# Patient Record
Sex: Male | Born: 1955
Health system: Southern US, Community
[De-identification: ages and names within clinical notes are randomized; demographics above are authoritative.]

## PROBLEM LIST (undated history)

## (undated) DIAGNOSIS — I10 Essential (primary) hypertension: Secondary | ICD-10-CM

## (undated) DIAGNOSIS — E785 Hyperlipidemia, unspecified: Secondary | ICD-10-CM

## (undated) HISTORY — PX: NOSE SURGERY: SHX723

## (undated) HISTORY — DX: Hyperlipidemia, unspecified: E78.5

## (undated) HISTORY — DX: Essential (primary) hypertension: I10

---

## 2012-10-03 ENCOUNTER — Other Ambulatory Visit: Payer: Self-pay | Admitting: *Deleted

## 2012-10-03 MED ORDER — AMLODIPINE BESYLATE 5 MG PO TABS: 5.0000 mg | ORAL_TABLET | Freq: Every day | ORAL | Status: DC

## 2012-10-03 NOTE — Telephone Encounter (Signed)
LAST OV 12/13. 

## 2012-11-06 ENCOUNTER — Other Ambulatory Visit: Payer: Self-pay | Admitting: *Deleted

## 2012-11-06 NOTE — Telephone Encounter (Signed)
Patient last seen on 03-29-12. Please advise

## 2012-11-07 ENCOUNTER — Other Ambulatory Visit: Payer: Self-pay | Admitting: Family Medicine

## 2012-11-07 DIAGNOSIS — I10 Essential (primary) hypertension: Secondary | ICD-10-CM

## 2012-11-07 MED ORDER — AMLODIPINE BESYLATE 5 MG PO TABS: 5.0000 mg | ORAL_TABLET | Freq: Every day | ORAL | Status: DC

## 2012-11-07 NOTE — Telephone Encounter (Signed)
Amlodipine sent to pharmacy. 

## 2012-11-20 ENCOUNTER — Ambulatory Visit (INDEPENDENT_AMBULATORY_CARE_PROVIDER_SITE_OTHER): Payer: BC Managed Care – PPO | Admitting: Family Medicine

## 2012-11-20 ENCOUNTER — Encounter: Payer: Self-pay | Admitting: Family Medicine

## 2012-11-20 VITALS — BP 139/84 | HR 77 | Temp 97.3°F | Ht 70.0 in | Wt 193.0 lb

## 2012-11-20 DIAGNOSIS — E785 Hyperlipidemia, unspecified: Secondary | ICD-10-CM

## 2012-11-20 DIAGNOSIS — I10 Essential (primary) hypertension: Secondary | ICD-10-CM

## 2012-11-20 DIAGNOSIS — Z0000 Encounter for general adult medical examination without abnormal findings: Secondary | ICD-10-CM

## 2012-11-20 LAB — POCT CBC
Granulocyte percent: 74.4 %G (ref 37–80)
HCT, POC: 43.4 % — AB (ref 43.5–53.7)
Hemoglobin: 14.9 g/dL (ref 14.1–18.1)
Lymph, poc: 1.4 (ref 0.6–3.4)
MCH, POC: 27.7 pg (ref 27–31.2)
MCHC: 34.4 g/dL (ref 31.8–35.4)
MCV: 80.5 fL (ref 80–97)
MPV: 7.9 fL (ref 0–99.8)
POC Granulocyte: 4.8 (ref 2–6.9)
POC LYMPH PERCENT: 22.2 %L (ref 10–50)
Platelet Count, POC: 172 10*3/uL (ref 142–424)
RBC: 5.4 M/uL (ref 4.69–6.13)
RDW, POC: 12.6 %
WBC: 6.5 10*3/uL (ref 4.6–10.2)

## 2012-11-20 MED ORDER — AMLODIPINE BESYLATE 5 MG PO TABS: 5.0000 mg | ORAL_TABLET | Freq: Every day | ORAL | Status: DC

## 2012-11-20 MED ORDER — ATORVASTATIN CALCIUM 40 MG PO TABS: 40.0000 mg | ORAL_TABLET | Freq: Every day | ORAL | Status: DC

## 2012-11-20 NOTE — Patient Instructions (Signed)

## 2012-11-20 NOTE — Progress Notes (Signed)
  Subjective:    Patient ID: Fernando Cooper, male    DOB: 1955-11-28, 56 y.o.   MRN: [id number]  HPI  This 57 y.o. male presents for evaluation of hypertension and hyperlipidemia.  He Has not been seen since 12/14.  He has not had a PSA.  He declines colonoscopy. He denies any other medical problems.  Review of Systems No chest pain, SOB, HA, dizziness, vision change, N/V, diarrhea, constipation, dysuria, urinary urgency or frequency, myalgias, arthralgias or rash.     Objective:   Physical Exam  Vital signs noted  Well developed well nourished male.  HEENT - Head atraumatic Normocephalic                Eyes - PERRLA, Conjuctiva - clear Sclera- Clear EOMI                Ears - EAC's Wnl TM's Wnl Gross Hearing WNL                Nose - Nares patent                 Throat - oropharanx wnl Respiratory - Lungs CTA bilateral Cardiac - RRR S1 and S2 without murmur GI - Abdomen soft Nontender and bowel sounds active x 4 Extremities - No edema. Neuro - Grossly intact.      Assessment & Plan:  Essential hypertension, benign - Plan: amLODipine (NORVASC) 5 MG tablet, controlled.  Other and unspecified hyperlipidemia - Plan: atorvastatin (LIPITOR) 40 MG tablet, check lipid panel today.  Routine general medical examination at a health care facility - Plan: POCT CBC, CMP14+EGFR, Lipid panel, PSA, total and free, TSH Declines colonoscopy and instructed patient to follow up in 6 months.

## 2012-11-21 LAB — PSA, TOTAL AND FREE
PSA, Free Pct: 35 %
PSA, Free: 0.35 ng/mL
PSA: 1 ng/mL (ref 0.0–4.0)

## 2012-11-21 LAB — CMP14+EGFR
ALT: 15 IU/L (ref 0–44)
AST: 19 IU/L (ref 0–40)
Albumin/Globulin Ratio: 2.6 — ABNORMAL HIGH (ref 1.1–2.5)
Albumin: 4.5 g/dL (ref 3.5–5.5)
Alkaline Phosphatase: 128 IU/L — ABNORMAL HIGH (ref 39–117)
BUN/Creatinine Ratio: 7 — ABNORMAL LOW (ref 9–20)
BUN: 7 mg/dL (ref 6–24)
CO2: 23 mmol/L (ref 18–29)
Calcium: 9.3 mg/dL (ref 8.7–10.2)
Chloride: 103 mmol/L (ref 97–108)
Creatinine, Ser: 1 mg/dL (ref 0.76–1.27)
GFR calc Af Amer: 95 mL/min/{1.73_m2} (ref >59)
GFR calc non Af Amer: 83 mL/min/{1.73_m2} (ref >59)
Globulin, Total: 1.7 g/dL (ref 1.5–4.5)
Glucose: 88 mg/dL (ref 65–99)
Potassium: 4 mmol/L (ref 3.5–5.2)
Sodium: 141 mmol/L (ref 134–144)
Total Bilirubin: 1.2 mg/dL (ref 0.0–1.2)
Total Protein: 6.2 g/dL (ref 6.0–8.5)

## 2012-11-21 LAB — LIPID PANEL
Chol/HDL Ratio: 3.9 ratio units (ref 0.0–5.0)
Cholesterol, Total: 138 mg/dL (ref 100–199)
HDL: 35 mg/dL — ABNORMAL LOW (ref >39)
LDL Calculated: 78 mg/dL (ref 0–99)
Triglycerides: 126 mg/dL (ref 0–149)
VLDL Cholesterol Cal: 25 mg/dL (ref 5–40)

## 2012-11-21 LAB — TSH: TSH: 1.48 u[IU]/mL (ref 0.450–4.500)

## 2012-11-22 ENCOUNTER — Other Ambulatory Visit: Payer: Self-pay | Admitting: Family Medicine

## 2013-11-13 ENCOUNTER — Other Ambulatory Visit: Payer: Self-pay | Admitting: *Deleted

## 2013-11-13 ENCOUNTER — Telehealth: Payer: Self-pay | Admitting: Family Medicine

## 2013-11-13 DIAGNOSIS — I10 Essential (primary) hypertension: Secondary | ICD-10-CM

## 2013-11-13 MED ORDER — AMLODIPINE BESYLATE 5 MG PO TABS: 5.0000 mg | ORAL_TABLET | Freq: Every day | ORAL | Status: DC

## 2013-11-13 NOTE — Telephone Encounter (Signed)
Last ov 8/14. ntbs 

## 2013-11-13 NOTE — Telephone Encounter (Signed)
Sent to [facility] for review. 

## 2014-02-12 ENCOUNTER — Other Ambulatory Visit: Payer: Self-pay | Admitting: Family Medicine

## 2014-02-13 ENCOUNTER — Encounter: Payer: Self-pay | Admitting: Family Medicine

## 2014-02-13 ENCOUNTER — Ambulatory Visit (INDEPENDENT_AMBULATORY_CARE_PROVIDER_SITE_OTHER): Payer: BC Managed Care – PPO | Admitting: Family Medicine

## 2014-02-13 VITALS — BP 141/84 | HR 73 | Temp 97.9°F | Ht 70.0 in | Wt 190.2 lb

## 2014-02-13 DIAGNOSIS — E785 Hyperlipidemia, unspecified: Secondary | ICD-10-CM

## 2014-02-13 DIAGNOSIS — I10 Essential (primary) hypertension: Secondary | ICD-10-CM

## 2014-02-13 DIAGNOSIS — Z139 Encounter for screening, unspecified: Secondary | ICD-10-CM

## 2014-02-13 LAB — POCT CBC
Granulocyte percent: 23.6 %G — AB (ref 37–80)
HCT, POC: 43.6 % (ref 43.5–53.7)
Hemoglobin: 14.6 g/dL (ref 14.1–18.1)
Lymph, poc: 1.5 (ref 0.6–3.4)
MCH, POC: 27.8 pg (ref 27–31.2)
MCHC: 33.5 g/dL (ref 31.8–35.4)
MCV: 82.9 fL (ref 80–97)
MPV: 7.7 fL (ref 0–99.8)
POC Granulocyte: 4.7 (ref 2–6.9)
POC LYMPH PERCENT: 23.6 %L (ref 10–50)
Platelet Count, POC: 185 10*3/uL (ref 142–424)
RBC: 5.3 M/uL (ref 4.69–6.13)
RDW, POC: 13.2 %
WBC: 6.4 10*3/uL (ref 4.6–10.2)

## 2014-02-13 MED ORDER — ATORVASTATIN CALCIUM 40 MG PO TABS: 40.0000 mg | ORAL_TABLET | Freq: Every day | ORAL | Status: DC

## 2014-02-13 MED ORDER — AMLODIPINE BESYLATE 5 MG PO TABS: 5.0000 mg | ORAL_TABLET | Freq: Every day | ORAL | Status: DC

## 2014-02-13 NOTE — Progress Notes (Signed)
   Subjective:    Patient ID: NASZIR COTT, male    DOB: 27-Dec-1955, 58 y.o.   MRN: [id number]  HPI Patient is here for follow up. He has hx of hypertension and hyperlipidemia.  He is needing med refills.  He is due for labs.  He denies any acute medical complaints.  Review of Systems  Constitutional: Negative for fever.  HENT: Negative for ear pain.   Eyes: Negative for discharge.  Respiratory: Negative for cough.   Cardiovascular: Negative for chest pain.  Gastrointestinal: Negative for abdominal distention.  Endocrine: Negative for polyuria.  Genitourinary: Negative for difficulty urinating.  Musculoskeletal: Negative for gait problem and neck pain.  Skin: Negative for color change and rash.  Neurological: Negative for speech difficulty and headaches.  Psychiatric/Behavioral: Negative for agitation.       Objective:    BP 141/84 mmHg  Pulse 73  Temp(Src) 97.9 F (36.6 C) (Oral)  Ht _0  (1.778 m)  Wt 190 lb 3.2 oz (86.274 kg)  BMI 27.29 kg/m2   Physical Exam  Constitutional: He is oriented to person, place, and time. He appears well-developed and well-nourished.  HENT:  Head: Normocephalic and atraumatic.  Mouth/Throat: Oropharynx is clear and moist.  Eyes: Pupils are equal, round, and reactive to light.  Neck: Normal range of motion. Neck supple.  Cardiovascular: Normal rate and regular rhythm.   No murmur heard. Pulmonary/Chest: Effort normal and breath sounds normal.  Abdominal: Soft. Bowel sounds are normal. There is no tenderness.  Neurological: He is alert and oriented to person, place, and time.  Skin: Skin is warm and dry.  Psychiatric: He has a normal mood and affect.          Assessment & Plan:     ICD-9-CM ICD-10-CM   1. Hyperlipemia 272.4 E78.5 CMP14+EGFR     Lipid panel     atorvastatin (LIPITOR) 40 MG tablet  2. Essential hypertension, benign 401.1 I10 POCT CBC     CMP14+EGFR     amLODipine (NORVASC) 5 MG tablet  3. Screening V82.9  Z13.9 POCT CBC     PSA, total and free     Thyroid Panel With TSH  4. Essential hypertension 401.9 I10 atorvastatin (LIPITOR) 40 MG tablet     No Follow-up on file.  Lysbeth Penner FNP

## 2014-02-14 LAB — CMP14+EGFR
ALT: 15 IU/L (ref 0–44)
AST: 18 IU/L (ref 0–40)
Albumin/Globulin Ratio: 2.2 (ref 1.1–2.5)
Albumin: 4.3 g/dL (ref 3.5–5.5)
Alkaline Phosphatase: 120 IU/L — ABNORMAL HIGH (ref 39–117)
BUN/Creatinine Ratio: 6 — ABNORMAL LOW (ref 9–20)
BUN: 7 mg/dL (ref 6–24)
CO2: 22 mmol/L (ref 18–29)
Calcium: 9.3 mg/dL (ref 8.7–10.2)
Chloride: 102 mmol/L (ref 97–108)
Creatinine, Ser: 1.1 mg/dL (ref 0.76–1.27)
GFR calc Af Amer: 84 mL/min/{1.73_m2} (ref >59)
GFR calc non Af Amer: 73 mL/min/{1.73_m2} (ref >59)
Globulin, Total: 2 g/dL (ref 1.5–4.5)
Glucose: 89 mg/dL (ref 65–99)
Potassium: 4 mmol/L (ref 3.5–5.2)
Sodium: 141 mmol/L (ref 134–144)
Total Bilirubin: 1.2 mg/dL (ref 0.0–1.2)
Total Protein: 6.3 g/dL (ref 6.0–8.5)

## 2014-02-14 LAB — LIPID PANEL
Chol/HDL Ratio: 4.8 ratio units (ref 0.0–5.0)
Cholesterol, Total: 154 mg/dL (ref 100–199)
HDL: 32 mg/dL — ABNORMAL LOW (ref >39)
LDL Calculated: 68 mg/dL (ref 0–99)
Triglycerides: 271 mg/dL — ABNORMAL HIGH (ref 0–149)
VLDL Cholesterol Cal: 54 mg/dL — ABNORMAL HIGH (ref 5–40)

## 2014-02-14 LAB — THYROID PANEL WITH TSH
Free Thyroxine Index: 2.6 (ref 1.2–4.9)
T3 Uptake Ratio: 27 % (ref 24–39)
T4, Total: 9.7 ug/dL (ref 4.5–12.0)
TSH: 1.11 u[IU]/mL (ref 0.450–4.500)

## 2014-02-14 LAB — PSA, TOTAL AND FREE
PSA, Free Pct: 30 %
PSA, Free: 0.36 ng/mL
PSA: 1.2 ng/mL (ref 0.0–4.0)

## 2015-03-13 ENCOUNTER — Other Ambulatory Visit: Payer: Self-pay | Admitting: Family Medicine

## 2015-03-16 ENCOUNTER — Other Ambulatory Visit: Payer: Self-pay | Admitting: Family Medicine

## 2015-03-16 NOTE — Telephone Encounter (Signed)
Called and informed patient that he will need to be seen for refills

## 2015-03-17 ENCOUNTER — Ambulatory Visit (INDEPENDENT_AMBULATORY_CARE_PROVIDER_SITE_OTHER): Payer: BLUE CROSS/BLUE SHIELD | Admitting: Family Medicine

## 2015-03-17 ENCOUNTER — Encounter: Payer: Self-pay | Admitting: Family Medicine

## 2015-03-17 VITALS — BP 130/88 | HR 73 | Temp 97.3°F | Ht 70.0 in | Wt 188.6 lb

## 2015-03-17 DIAGNOSIS — Z1211 Encounter for screening for malignant neoplasm of colon: Secondary | ICD-10-CM

## 2015-03-17 DIAGNOSIS — I10 Essential (primary) hypertension: Secondary | ICD-10-CM | POA: Diagnosis not present

## 2015-03-17 DIAGNOSIS — N400 Enlarged prostate without lower urinary tract symptoms: Secondary | ICD-10-CM | POA: Diagnosis not present

## 2015-03-17 DIAGNOSIS — E785 Hyperlipidemia, unspecified: Secondary | ICD-10-CM

## 2015-03-17 MED ORDER — AMLODIPINE BESYLATE 5 MG PO TABS
5.0000 mg | ORAL_TABLET | Freq: Every day | ORAL | Status: DC
Start: 2015-03-17 — End: 2016-03-08

## 2015-03-17 MED ORDER — ATORVASTATIN CALCIUM 40 MG PO TABS: 40.0000 mg | ORAL_TABLET | Freq: Every day | ORAL | Status: DC

## 2015-03-17 NOTE — Assessment & Plan Note (Signed)
Continue cholesterol and recheck levels.

## 2015-03-17 NOTE — Assessment & Plan Note (Signed)
Continue medication for blood pressure, controlled, will monitor home measurements over the next year.

## 2015-03-17 NOTE — Progress Notes (Signed)
BP 130/88 mmHg  Pulse 73  Temp(Src) 97.3 F (36.3 C) (Oral)  Ht '5\' 10"'  (1.778 m)  Wt 188 lb 9.6 oz (85.548 kg)  BMI 27.06 kg/m2   Subjective:    Patient ID: Fernando Cooper, male    DOB: 1955-12-01, 59 y.o.   MRN: [id number]  HPI: Fernando Cooper is a 59 y.o. male presenting on 03/17/2015 for Medication Refill   HPI Hypertension Patient presents today for recheck on his high blood pressure. He is currently taking Norvasc 5 mg. His blood pressure today is 130/88. He says sometimes at home she gets readings that are over 140 but most the time they're in the 130s. Patient denies headaches, blurred vision, chest pains, shortness of breath, or weakness. Denies any side effects from medication and is content with current medication.  Hyperlipidemia Patient presents today for a recheck on his cholesterol as well. We will put in labs so that he can do them fasting. He will come back for that. He denies any myalgias or issues with his medications.  Relevant past medical, surgical, family and social history reviewed and updated as indicated. Interim medical history since our last visit reviewed. Allergies and medications reviewed and updated.  Review of Systems  Constitutional: Negative for fever and chills.  HENT: Negative for congestion, ear discharge and ear pain.   Eyes: Negative for discharge and visual disturbance.  Respiratory: Negative for chest tightness, shortness of breath and wheezing.   Cardiovascular: Negative for chest pain and leg swelling.  Gastrointestinal: Negative for abdominal pain, diarrhea and constipation.  Genitourinary: Negative for difficulty urinating.  Musculoskeletal: Negative for back pain, arthralgias and gait problem.  Skin: Negative for rash.  Neurological: Negative for dizziness, syncope, light-headedness and headaches.  All other systems reviewed and are negative.   Per HPI unless specifically indicated above     Medication List       This list is  accurate as of: 03/17/15 10:16 AM.  Always use your most recent med list.               amLODipine 5 MG tablet  Commonly known as:  NORVASC  Take 1 tablet (5 mg total) by mouth daily.     atorvastatin 40 MG tablet  Commonly known as:  LIPITOR  Take 1 tablet (40 mg total) by mouth daily.           Objective:    BP 130/88 mmHg  Pulse 73  Temp(Src) 97.3 F (36.3 C) (Oral)  Ht '5\' 10"'  (1.778 m)  Wt 188 lb 9.6 oz (85.548 kg)  BMI 27.06 kg/m2  Wt Readings from Last 3 Encounters:  03/17/15 188 lb 9.6 oz (85.548 kg)  02/13/14 190 lb 3.2 oz (86.274 kg)  11/20/12 193 lb (87.544 kg)    Physical Exam  Constitutional: He is oriented to person, place, and time. He appears well-developed and well-nourished. No distress.  Eyes: Conjunctivae and EOM are normal. Pupils are equal, round, and reactive to light. Right eye exhibits no discharge. No scleral icterus.  Neck: Neck supple. No thyromegaly present.  Cardiovascular: Normal rate, regular rhythm, normal heart sounds and intact distal pulses.   No murmur heard. Pulmonary/Chest: Effort normal and breath sounds normal. No respiratory distress. He has no wheezes.  Abdominal: He exhibits no distension.  Musculoskeletal: Normal range of motion. He exhibits no edema or tenderness.  Lymphadenopathy:    He has no cervical adenopathy.  Neurological: He is alert and oriented to person, place,  and time. Coordination normal.  Skin: Skin is warm and dry. No rash noted. He is not diaphoretic.  Psychiatric: He has a normal mood and affect. His behavior is normal.  Nursing note and vitals reviewed.   Results for orders placed or performed in visit on 02/13/14  CMP14+EGFR  Result Value Ref Range   Glucose 89 65 - 99 mg/dL   BUN 7 6 - 24 mg/dL   Creatinine, Ser 1.10 0.76 - 1.27 mg/dL   GFR calc non Af Amer 73 >59 mL/min/1.73   GFR calc Af Amer 84 >59 mL/min/1.73   BUN/Creatinine Ratio 6 (L) 9 - 20   Sodium 141 134 - 144 mmol/L   Potassium 4.0  3.5 - 5.2 mmol/L   Chloride 102 97 - 108 mmol/L   CO2 22 18 - 29 mmol/L   Calcium 9.3 8.7 - 10.2 mg/dL   Total Protein 6.3 6.0 - 8.5 g/dL   Albumin 4.3 3.5 - 5.5 g/dL   Globulin, Total 2.0 1.5 - 4.5 g/dL   Albumin/Globulin Ratio 2.2 1.1 - 2.5   Total Bilirubin 1.2 0.0 - 1.2 mg/dL   Alkaline Phosphatase 120 (H) 39 - 117 IU/L   AST 18 0 - 40 IU/L   ALT 15 0 - 44 IU/L  Lipid panel  Result Value Ref Range   Cholesterol, Total 154 100 - 199 mg/dL   Triglycerides 271 (H) 0 - 149 mg/dL   HDL 32 (L) >39 mg/dL   VLDL Cholesterol Cal 54 (H) 5 - 40 mg/dL   LDL Calculated 68 0 - 99 mg/dL   Chol/HDL Ratio 4.8 0.0 - 5.0 ratio units  PSA, total and free  Result Value Ref Range   PSA 1.2 0.0 - 4.0 ng/mL   PSA, Free 0.36 N/A ng/mL   PSA, Free Pct 30.0 %  Thyroid Panel With TSH  Result Value Ref Range   TSH 1.110 0.450 - 4.500 uIU/mL   T4, Total 9.7 4.5 - 12.0 ug/dL   T3 Uptake Ratio 27 24 - 39 %   Free Thyroxine Index 2.6 1.2 - 4.9  POCT CBC  Result Value Ref Range   WBC 6.4 4.6 - 10.2 K/uL   Lymph, poc 1.5 0.6 - 3.4   POC LYMPH PERCENT 23.6 10 - 50 %L   POC Granulocyte 4.7 2 - 6.9   Granulocyte percent 23.6 (A) 37 - 80 %G   RBC 5.3 4.69 - 6.13 M/uL   Hemoglobin 14.6 14.1 - 18.1 g/dL   HCT, POC 43.6 43.5 - 53.7 %   MCV 82.9 80 - 97 fL   MCH, POC 27.8 27 - 31.2 pg   MCHC 33.5 31.8 - 35.4 g/dL   RDW, POC 13.2 %   Platelet Count, POC 185.0 142 - 424 K/uL   MPV 7.7 0 - 99.8 fL      Assessment & Plan:   Problem List Items Addressed This Visit      Cardiovascular and Mediastinum   Essential hypertension, benign    Continue medication for blood pressure, controlled, will monitor home measurements over the next year.      Relevant Medications   amLODipine (NORVASC) 5 MG tablet   atorvastatin (LIPITOR) 40 MG tablet   Other Relevant Orders   CMP14+EGFR     Other   Hyperlipemia    Continue cholesterol and recheck levels.      Relevant Medications   amLODipine (NORVASC) 5 MG  tablet   atorvastatin (LIPITOR) 40 MG tablet  Other Relevant Orders   Lipid panel    Other Visit Diagnoses    Special screening for malignant neoplasms, colon    -  Primary    Relevant Orders    Fecal occult blood, imunochemical    Enlarged prostate        Relevant Orders    PSA, total and free        Follow up plan: Return in about 1 year (around 03/16/2016), or if symptoms worsen or fail to improve.  Counseling provided for all of the vaccine components Orders Placed This Encounter  Procedures  . Fecal occult blood, imunochemical  . CMP14+EGFR  . Lipid panel  . PSA, total and free    Caryl Pina, MD [facility] 03/17/2015, 10:16 AM

## 2015-10-07 DIAGNOSIS — H20021 Recurrent acute iridocyclitis, right eye: Secondary | ICD-10-CM | POA: Diagnosis not present

## 2015-10-09 DIAGNOSIS — H20021 Recurrent acute iridocyclitis, right eye: Secondary | ICD-10-CM | POA: Diagnosis not present

## 2015-10-09 DIAGNOSIS — H40051 Ocular hypertension, right eye: Secondary | ICD-10-CM | POA: Diagnosis not present

## 2015-10-27 DIAGNOSIS — H15101 Unspecified episcleritis, right eye: Secondary | ICD-10-CM | POA: Diagnosis not present

## 2015-10-27 DIAGNOSIS — H40051 Ocular hypertension, right eye: Secondary | ICD-10-CM | POA: Diagnosis not present

## 2015-11-10 DIAGNOSIS — H20021 Recurrent acute iridocyclitis, right eye: Secondary | ICD-10-CM | POA: Diagnosis not present

## 2016-03-08 ENCOUNTER — Other Ambulatory Visit: Payer: Self-pay | Admitting: Family Medicine

## 2016-03-08 DIAGNOSIS — I10 Essential (primary) hypertension: Secondary | ICD-10-CM

## 2016-03-08 NOTE — Telephone Encounter (Signed)
Last seen 03/17/2015, please advise and route to pools

## 2016-03-11 ENCOUNTER — Ambulatory Visit: Payer: BLUE CROSS/BLUE SHIELD | Admitting: Family Medicine

## 2016-03-11 ENCOUNTER — Ambulatory Visit (INDEPENDENT_AMBULATORY_CARE_PROVIDER_SITE_OTHER): Payer: BLUE CROSS/BLUE SHIELD | Admitting: Family Medicine

## 2016-03-11 ENCOUNTER — Encounter: Payer: Self-pay | Admitting: Family Medicine

## 2016-03-11 VITALS — BP 163/93 | HR 61 | Temp 97.3°F | Ht 70.0 in | Wt 190.0 lb

## 2016-03-11 DIAGNOSIS — Z125 Encounter for screening for malignant neoplasm of prostate: Secondary | ICD-10-CM

## 2016-03-11 DIAGNOSIS — E7800 Pure hypercholesterolemia, unspecified: Secondary | ICD-10-CM | POA: Diagnosis not present

## 2016-03-11 DIAGNOSIS — I10 Essential (primary) hypertension: Secondary | ICD-10-CM

## 2016-03-11 MED ORDER — AMLODIPINE BESYLATE 5 MG PO TABS: 5.0000 mg | ORAL_TABLET | Freq: Every day | ORAL | 3 refills | Status: DC

## 2016-03-11 MED ORDER — ATORVASTATIN CALCIUM 40 MG PO TABS: 40.0000 mg | ORAL_TABLET | Freq: Every day | ORAL | 3 refills | Status: DC

## 2016-03-11 NOTE — Progress Notes (Signed)
BP (!) 159/91 (BP Location: Left Arm)   Pulse 84   Temp 97.3 F (36.3 C) (Oral)   Ht '5\' 10"'  (1.778 m)   Wt 190 lb (86.2 kg)   BMI 27.26 kg/m    Subjective:    Patient ID: Fernando Cooper, male    DOB: Dec 02, 1955, 60 y.o.   MRN: [id number]  HPI: Fernando Cooper is a 60 y.o. male presenting on 03/11/2016 for Hyperlipidemia (yearly) and Hypertension   HPI Hypertension Patient is coming in for a blood pressure recheck today. His blood pressure is elevated at 159/91. He is currently on amlodipine 5 mg. On his last visit 1 year ago his blood pressure was controlled. He says he ate some very salty stuff over the past few days that may have affected his blood pressure and he will change his diet and wants to try that before changing any medication. Patient denies headaches, blurred vision, chest pains, shortness of breath, or weakness. Denies any side effects from medication and is content with current medication.   Hyperlipidemia Patient is coming in for cholesterol check and fasting labs today. He is currently on Lipitor 40 mg. He denies any issues with myalgias or liver issues and knows of. He denies any focal numbness or weakness or chest pain or palpitations.  Relevant past medical, surgical, family and social history reviewed and updated as indicated. Interim medical history since our last visit reviewed. Allergies and medications reviewed and updated.  Review of Systems  Constitutional: Negative for chills and fever.  Respiratory: Negative for shortness of breath and wheezing.   Cardiovascular: Negative for chest pain and leg swelling.  Musculoskeletal: Negative for back pain and gait problem.  Skin: Negative for rash.  Neurological: Negative for dizziness, weakness, light-headedness, numbness and headaches.  All other systems reviewed and are negative.   Per HPI unless specifically indicated above     Medication List       Accurate as of 03/11/16  3:13 PM. Always use your most  recent med list.          amLODipine 5 MG tablet Commonly known as:  NORVASC Take 1 tablet (5 mg total) by mouth daily.   atorvastatin 40 MG tablet Commonly known as:  LIPITOR Take 1 tablet (40 mg total) by mouth daily.          Objective:    BP (!) 159/91 (BP Location: Left Arm)   Pulse 84   Temp 97.3 F (36.3 C) (Oral)   Ht '5\' 10"'  (1.778 m)   Wt 190 lb (86.2 kg)   BMI 27.26 kg/m   Wt Readings from Last 3 Encounters:  03/11/16 190 lb (86.2 kg)  03/17/15 188 lb 9.6 oz (85.5 kg)  02/13/14 190 lb 3.2 oz (86.3 kg)    Physical Exam  Constitutional: He is oriented to person, place, and time. He appears well-developed and well-nourished. No distress.  Eyes: Conjunctivae are normal. Right eye exhibits no discharge. Left eye exhibits no discharge. No scleral icterus.  Neck: Neck supple. No thyromegaly present.  Cardiovascular: Normal rate, regular rhythm, normal heart sounds and intact distal pulses.   No murmur heard. Pulmonary/Chest: Effort normal and breath sounds normal. No respiratory distress. He has no wheezes. He has no rales.  Musculoskeletal: Normal range of motion. He exhibits no edema.  Lymphadenopathy:    He has no cervical adenopathy.  Neurological: He is alert and oriented to person, place, and time. Coordination normal.  Skin: Skin is warm  and dry. No rash noted. He is not diaphoretic.  Psychiatric: He has a normal mood and affect. His behavior is normal.  Nursing note and vitals reviewed.     Assessment & Plan:   Problem List Items Addressed This Visit      Cardiovascular and Mediastinum   Essential hypertension, benign - Primary   Relevant Medications   amLODipine (NORVASC) 5 MG tablet   atorvastatin (LIPITOR) 40 MG tablet   Other Relevant Orders   CMP14+EGFR     Other   Hyperlipemia   Relevant Medications   amLODipine (NORVASC) 5 MG tablet   atorvastatin (LIPITOR) 40 MG tablet   Other Relevant Orders   Lipid panel    Other Visit Diagnoses     Prostate cancer screening       Relevant Orders   PSA, total and free       Follow up plan: Return if symptoms worsen or fail to improve.  Counseling provided for all of the vaccine components Orders Placed This Encounter  Procedures  . CMP14+EGFR  . Lipid panel  . PSA, total and free    Caryl Pina, MD [facility] 03/11/2016, 3:13 PM

## 2016-03-11 NOTE — Patient Instructions (Signed)
Continue current medications. Continue good therapeutic lifestyle changes which include good diet and exercise. Fall precautions discussed with patient. If an FOBT was given today- please return it to our front desk. If you are over 62 years old - you may need Prevnar 13 or the adult Pneumonia vaccine.  **Flu shots are available--- please call and schedule a FLU-CLINIC appointment**  After your visit with us today you will receive a survey in the mail or online from [employer] regarding your care with us. Please take a moment to fill this out. Your feedback is very important to us as you can help us better understand your patient needs as well as improve your experience and satisfaction. WE CARE ABOUT YOU!!!    

## 2016-03-12 LAB — SPECIMEN STATUS

## 2016-03-14 ENCOUNTER — Other Ambulatory Visit: Payer: BLUE CROSS/BLUE SHIELD

## 2016-03-14 ENCOUNTER — Telehealth: Payer: Self-pay | Admitting: Pediatrics

## 2016-03-14 DIAGNOSIS — E875 Hyperkalemia: Secondary | ICD-10-CM

## 2016-03-14 LAB — CMP14+EGFR
ALT: 16 IU/L (ref 0–44)
AST: 22 IU/L (ref 0–40)
Albumin/Globulin Ratio: 2.1 (ref 1.2–2.2)
Albumin: 4.5 g/dL (ref 3.6–4.8)
Alkaline Phosphatase: 116 IU/L (ref 39–117)
BUN/Creatinine Ratio: 6 — ABNORMAL LOW (ref 10–24)
BUN: 6 mg/dL — ABNORMAL LOW (ref 8–27)
Bilirubin Total: 1.3 mg/dL — ABNORMAL HIGH (ref 0.0–1.2)
CO2: 23 mmol/L (ref 18–29)
Calcium: 9.3 mg/dL (ref 8.6–10.2)
Chloride: 101 mmol/L (ref 96–106)
Creatinine, Ser: 1.01 mg/dL (ref 0.76–1.27)
GFR calc Af Amer: 92 mL/min/{1.73_m2} (ref >59)
GFR calc non Af Amer: 79 mL/min/{1.73_m2} (ref >59)
Globulin, Total: 2.1 g/dL (ref 1.5–4.5)
Glucose: 80 mg/dL (ref 65–99)
Potassium: 5.9 mmol/L (ref 3.5–5.2)
Sodium: 141 mmol/L (ref 134–144)
Total Protein: 6.6 g/dL (ref 6.0–8.5)

## 2016-03-14 LAB — LIPID PANEL
Chol/HDL Ratio: 5.7 ratio units — ABNORMAL HIGH (ref 0.0–5.0)
Cholesterol, Total: 199 mg/dL (ref 100–199)
HDL: 35 mg/dL — ABNORMAL LOW (ref >39)
LDL Calculated: 120 mg/dL — ABNORMAL HIGH (ref 0–99)
Triglycerides: 222 mg/dL — ABNORMAL HIGH (ref 0–149)
VLDL Cholesterol Cal: 44 mg/dL — ABNORMAL HIGH (ref 5–40)

## 2016-03-14 LAB — PSA, TOTAL AND FREE
PSA, Free Pct: 34 %
PSA, Free: 0.34 ng/mL
Prostate Specific Ag, Serum: 1 ng/mL (ref 0.0–4.0)

## 2016-03-14 NOTE — Telephone Encounter (Signed)
Elevated K, not on ACE-I, normal Cr. Pt to come by today for recheck, spoke with his wife who said she would let him know.

## 2016-03-15 LAB — BMP8+EGFR
BUN/Creatinine Ratio: 9 — ABNORMAL LOW (ref 10–24)
BUN: 9 mg/dL (ref 8–27)
CO2: 25 mmol/L (ref 18–29)
Calcium: 9.4 mg/dL (ref 8.6–10.2)
Chloride: 103 mmol/L (ref 96–106)
Creatinine, Ser: 1.05 mg/dL (ref 0.76–1.27)
GFR calc Af Amer: 88 mL/min/{1.73_m2} (ref >59)
GFR calc non Af Amer: 76 mL/min/{1.73_m2} (ref >59)
Glucose: 100 mg/dL — ABNORMAL HIGH (ref 65–99)
Potassium: 4.7 mmol/L (ref 3.5–5.2)
Sodium: 142 mmol/L (ref 134–144)

## 2016-03-23 ENCOUNTER — Telehealth: Payer: Self-pay | Admitting: Family Medicine

## 2016-03-23 NOTE — Telephone Encounter (Signed)
Patient aware of lab results.

## 2016-04-11 ENCOUNTER — Ambulatory Visit: Payer: BLUE CROSS/BLUE SHIELD | Admitting: Family Medicine

## 2016-05-19 DIAGNOSIS — H1131 Conjunctival hemorrhage, right eye: Secondary | ICD-10-CM | POA: Diagnosis not present

## 2016-05-19 DIAGNOSIS — H524 Presbyopia: Secondary | ICD-10-CM | POA: Diagnosis not present

## 2016-05-19 DIAGNOSIS — H5203 Hypermetropia, bilateral: Secondary | ICD-10-CM | POA: Diagnosis not present

## 2017-01-24 DIAGNOSIS — X32XXXA Exposure to sunlight, initial encounter: Secondary | ICD-10-CM | POA: Diagnosis not present

## 2017-01-24 DIAGNOSIS — L821 Other seborrheic keratosis: Secondary | ICD-10-CM | POA: Diagnosis not present

## 2017-01-24 DIAGNOSIS — D225 Melanocytic nevi of trunk: Secondary | ICD-10-CM | POA: Diagnosis not present

## 2017-01-24 DIAGNOSIS — L570 Actinic keratosis: Secondary | ICD-10-CM | POA: Diagnosis not present

## 2017-01-24 DIAGNOSIS — C44229 Squamous cell carcinoma of skin of left ear and external auricular canal: Secondary | ICD-10-CM | POA: Diagnosis not present

## 2017-03-06 ENCOUNTER — Other Ambulatory Visit: Payer: Self-pay | Admitting: Family Medicine

## 2017-03-06 DIAGNOSIS — I10 Essential (primary) hypertension: Secondary | ICD-10-CM

## 2017-06-08 ENCOUNTER — Other Ambulatory Visit: Payer: Self-pay | Admitting: Family Medicine

## 2017-06-08 DIAGNOSIS — I10 Essential (primary) hypertension: Secondary | ICD-10-CM

## 2017-06-09 ENCOUNTER — Encounter: Payer: Self-pay | Admitting: Family Medicine

## 2017-06-09 ENCOUNTER — Ambulatory Visit: Payer: BLUE CROSS/BLUE SHIELD | Admitting: Family Medicine

## 2017-06-09 VITALS — BP 153/88 | HR 70 | Ht 70.0 in | Wt 186.0 lb

## 2017-06-09 DIAGNOSIS — Z114 Encounter for screening for human immunodeficiency virus [HIV]: Secondary | ICD-10-CM

## 2017-06-09 DIAGNOSIS — I10 Essential (primary) hypertension: Secondary | ICD-10-CM

## 2017-06-09 DIAGNOSIS — E7800 Pure hypercholesterolemia, unspecified: Secondary | ICD-10-CM

## 2017-06-09 DIAGNOSIS — Z1159 Encounter for screening for other viral diseases: Secondary | ICD-10-CM | POA: Diagnosis not present

## 2017-06-09 MED ORDER — ATORVASTATIN CALCIUM 40 MG PO TABS: 40.0000 mg | ORAL_TABLET | Freq: Every day | ORAL | 3 refills | Status: DC

## 2017-06-09 MED ORDER — AMLODIPINE BESYLATE 10 MG PO TABS: 10.0000 mg | ORAL_TABLET | Freq: Every day | ORAL | 3 refills | Status: DC

## 2017-06-09 NOTE — Progress Notes (Signed)
BP (!) 153/88   Pulse 70   Ht '5\' 10"'  (1.778 m)   Wt 186 lb (84.4 kg)   BMI 26.69 kg/m    Subjective:    Patient ID: Fernando Cooper, male    DOB: 1956-01-08, 62 y.o.   MRN: [id number]  HPI: Fernando Cooper is a 62 y.o. male presenting on 06/09/2017 for Follow-up (Medication refill ) and Hypertension   HPI Hypertension Patient is currently on amlodipine 5, and their blood pressure today is 153/88. Patient denies any lightheadedness or dizziness. Patient denies headaches, blurred vision, chest pains, shortness of breath, or weakness. Denies any side effects from medication and is content with current medication.   Hyperlipidemia Patient is coming in for recheck of his hyperlipidemia. The patient is currently is not taking his Lipitor and we will recheck the labs and he has no intention of taking it currently. They deny any issues with myalgias or history of liver damage from it. They deny any focal numbness or weakness or chest pain.   Relevant past medical, surgical, family and social history reviewed and updated as indicated. Interim medical history since our last visit reviewed. Allergies and medications reviewed and updated.  Review of Systems  Constitutional: Negative for chills and fever.  HENT: Negative for congestion.   Respiratory: Negative for shortness of breath and wheezing.   Cardiovascular: Negative for chest pain and leg swelling.  Musculoskeletal: Negative for back pain and gait problem.  Skin: Negative for rash.  Neurological: Negative for dizziness, weakness and light-headedness.  All other systems reviewed and are negative.   Per HPI unless specifically indicated above   Allergies as of 06/09/2017   No Known Allergies     Medication List        Accurate as of 06/09/17  2:18 PM. Always use your most recent med list.          amLODipine 5 MG tablet Commonly known as:  NORVASC Take 1 tablet (5 mg total) by mouth daily.   atorvastatin 40 MG tablet Commonly  known as:  LIPITOR Take 1 tablet (40 mg total) by mouth daily.          Objective:    BP (!) 153/88   Pulse 70   Ht '5\' 10"'  (1.778 m)   Wt 186 lb (84.4 kg)   BMI 26.69 kg/m   Wt Readings from Last 3 Encounters:  06/09/17 186 lb (84.4 kg)  03/11/16 190 lb (86.2 kg)  03/17/15 188 lb 9.6 oz (85.5 kg)    Physical Exam  Constitutional: He is oriented to person, place, and time. He appears well-developed and well-nourished. No distress.  Eyes: Conjunctivae are normal. No scleral icterus.  Neck: Neck supple. No thyromegaly present.  Cardiovascular: Normal rate, regular rhythm, normal heart sounds and intact distal pulses.  No murmur heard. Pulmonary/Chest: Effort normal and breath sounds normal. No respiratory distress. He has no wheezes. He has no rales.  Musculoskeletal: Normal range of motion. He exhibits no edema.  Lymphadenopathy:    He has no cervical adenopathy.  Neurological: He is alert and oriented to person, place, and time. Coordination normal.  Skin: Skin is warm and dry. No rash noted. He is not diaphoretic.  Psychiatric: He has a normal mood and affect. His behavior is normal.  Nursing note and vitals reviewed.       Assessment & Plan:   Problem List Items Addressed This Visit      Cardiovascular and Mediastinum   Essential  hypertension, benign   Relevant Medications   amLODipine (NORVASC) 10 MG tablet   atorvastatin (LIPITOR) 40 MG tablet   Other Relevant Orders   CMP14+EGFR   CBC with Differential/Platelet     Other   Hyperlipemia - Primary   Relevant Medications   amLODipine (NORVASC) 10 MG tablet   atorvastatin (LIPITOR) 40 MG tablet   Other Relevant Orders   Lipid panel    Other Visit Diagnoses    Need for hepatitis C screening test       Relevant Orders   Hepatitis C antibody   Screening for HIV without presence of risk factors       Relevant Orders   HIV antibody       Follow up plan: Return in about 4 weeks (around 07/07/2017), or if  symptoms worsen or fail to improve, for Hypertension.  Counseling provided for all of the vaccine components Orders Placed This Encounter  Procedures  . CMP14+EGFR  . CBC with Differential/Platelet  . Lipid panel  . Hepatitis C antibody  . HIV antibody    Caryl Pina, MD [facility] 06/09/2017, 2:18 PM

## 2017-06-10 LAB — CBC WITH DIFFERENTIAL/PLATELET
Basophils Absolute: 0 10*3/uL (ref 0.0–0.2)
Basos: 0 %
EOS (ABSOLUTE): 0.2 10*3/uL (ref 0.0–0.4)
Eos: 2 %
Hematocrit: 45.2 % (ref 37.5–51.0)
Hemoglobin: 15.5 g/dL (ref 13.0–17.7)
Immature Grans (Abs): 0 10*3/uL (ref 0.0–0.1)
Immature Granulocytes: 0 %
Lymphocytes Absolute: 1.5 10*3/uL (ref 0.7–3.1)
Lymphs: 22 %
MCH: 27.6 pg (ref 26.6–33.0)
MCHC: 34.3 g/dL (ref 31.5–35.7)
MCV: 81 fL (ref 79–97)
Monocytes Absolute: 0.5 10*3/uL (ref 0.1–0.9)
Monocytes: 7 %
Neutrophils Absolute: 4.4 10*3/uL (ref 1.4–7.0)
Neutrophils: 69 %
Platelets: 205 10*3/uL (ref 150–379)
RBC: 5.61 x10E6/uL (ref 4.14–5.80)
RDW: 13.7 % (ref 12.3–15.4)
WBC: 6.5 10*3/uL (ref 3.4–10.8)

## 2017-06-10 LAB — LIPID PANEL
Chol/HDL Ratio: 5.5 ratio — ABNORMAL HIGH (ref 0.0–5.0)
Cholesterol, Total: 221 mg/dL — ABNORMAL HIGH (ref 100–199)
HDL: 40 mg/dL (ref >39)
LDL Calculated: 157 mg/dL — ABNORMAL HIGH (ref 0–99)
Triglycerides: 118 mg/dL (ref 0–149)
VLDL Cholesterol Cal: 24 mg/dL (ref 5–40)

## 2017-06-10 LAB — CMP14+EGFR
ALT: 12 IU/L (ref 0–44)
AST: 17 IU/L (ref 0–40)
Albumin/Globulin Ratio: 2.4 — ABNORMAL HIGH (ref 1.2–2.2)
Albumin: 4.6 g/dL (ref 3.6–4.8)
Alkaline Phosphatase: 113 IU/L (ref 39–117)
BUN/Creatinine Ratio: 7 — ABNORMAL LOW (ref 10–24)
BUN: 8 mg/dL (ref 8–27)
Bilirubin Total: 1.3 mg/dL — ABNORMAL HIGH (ref 0.0–1.2)
CO2: 23 mmol/L (ref 20–29)
Calcium: 9.2 mg/dL (ref 8.6–10.2)
Chloride: 102 mmol/L (ref 96–106)
Creatinine, Ser: 1.23 mg/dL (ref 0.76–1.27)
GFR calc Af Amer: 72 mL/min/{1.73_m2} (ref >59)
GFR calc non Af Amer: 62 mL/min/{1.73_m2} (ref >59)
Globulin, Total: 1.9 g/dL (ref 1.5–4.5)
Glucose: 89 mg/dL (ref 65–99)
Potassium: 4.4 mmol/L (ref 3.5–5.2)
Sodium: 140 mmol/L (ref 134–144)
Total Protein: 6.5 g/dL (ref 6.0–8.5)

## 2017-06-10 LAB — HIV ANTIBODY (ROUTINE TESTING W REFLEX): HIV Screen 4th Generation wRfx: NONREACTIVE

## 2017-06-10 LAB — HEPATITIS C ANTIBODY: Hep C Virus Ab: <0.1 s/co ratio (ref 0.0–0.9)

## 2017-06-14 ENCOUNTER — Telehealth: Payer: Self-pay | Admitting: Family Medicine

## 2017-06-14 NOTE — Telephone Encounter (Signed)
Refer to lab result  

## 2017-07-10 ENCOUNTER — Encounter: Payer: Self-pay | Admitting: Family Medicine

## 2017-07-10 ENCOUNTER — Ambulatory Visit: Payer: BLUE CROSS/BLUE SHIELD | Admitting: Family Medicine

## 2017-07-10 VITALS — BP 138/86 | HR 79 | Temp 97.5°F | Ht 70.0 in | Wt 186.0 lb

## 2017-07-10 DIAGNOSIS — I10 Essential (primary) hypertension: Secondary | ICD-10-CM | POA: Diagnosis not present

## 2017-07-10 NOTE — Progress Notes (Signed)
BP 138/86   Pulse 79   Temp (!) 97.5 F (36.4 C) (Oral)   Ht '5\' 10"'  (1.778 m)   Wt 186 lb (84.4 kg)   BMI 26.69 kg/m    Subjective:    Patient ID: Fernando Cooper, male    DOB: [birth year], 62 y.o.   MRN: [id number]  HPI: [name] is a 62 y.o. male presenting on 07/10/2017 for Hypertension (4 week follow up)   HPI Hypertension Patient is currently on amlodipine 10, and their blood pressure today is 138/86. Patient denies any lightheadedness or dizziness. Patient denies headaches, blurred vision, chest pains, shortness of breath, or weakness. Denies any side effects from medication and is content with current medication.   Relevant past medical, surgical, family and social history reviewed and updated as indicated. Interim medical history since our last visit reviewed. Allergies and medications reviewed and updated.  Review of Systems  Constitutional: Negative for chills and fever.  Respiratory: Negative for shortness of breath and wheezing.   Cardiovascular: Negative for chest pain and leg swelling.  Musculoskeletal: Negative for back pain and gait problem.  Skin: Negative for rash.  Neurological: Negative for dizziness.  All other systems reviewed and are negative.   Per HPI unless specifically indicated above   Allergies as of 07/10/2017   No Known Allergies     Medication List        Accurate as of 07/10/17  2:22 PM. Always use your most recent med list.          amLODipine 10 MG tablet Commonly known as:  NORVASC Take 1 tablet (10 mg total) by mouth daily.   atorvastatin 40 MG tablet Commonly known as:  LIPITOR Take 1 tablet (40 mg total) by mouth daily.          Objective:    BP 138/86   Pulse 79   Temp (!) 97.5 F (36.4 C) (Oral)   Ht '5\' 10"'  (1.778 m)   Wt 186 lb (84.4 kg)   BMI 26.69 kg/m   Wt Readings from Last 3 Encounters:  07/10/17 186 lb (84.4 kg)  06/09/17 186 lb (84.4 kg)  03/11/16 190 lb (86.2 kg)    Physical Exam    Constitutional: He is oriented to person, place, and time. He appears well-developed and well-nourished. No distress.  Eyes: Conjunctivae are normal. No scleral icterus.  Cardiovascular: Normal rate, regular rhythm, normal heart sounds and intact distal pulses.  No murmur heard. Pulmonary/Chest: Effort normal and breath sounds normal. No respiratory distress. He has no wheezes. He has no rales.  Neurological: He is alert and oriented to person, place, and time. Coordination normal.  Skin: Skin is warm and dry. No rash noted. He is not diaphoretic.  Psychiatric: He has a normal mood and affect. His behavior is normal.  Nursing note and vitals reviewed.   Results for orders placed or performed in visit on 06/09/17  CMP14+EGFR  Result Value Ref Range   Glucose 89 65 - 99 mg/dL   BUN 8 8 - 27 mg/dL   Creatinine, Ser 1.23 0.76 - 1.27 mg/dL   GFR calc non Af Amer 62 >59 mL/min/1.73   GFR calc Af Amer 72 >59 mL/min/1.73   BUN/Creatinine Ratio 7 (L) 10 - 24   Sodium 140 134 - 144 mmol/L   Potassium 4.4 3.5 - 5.2 mmol/L   Chloride 102 96 - 106 mmol/L   CO2 23 20 - 29 mmol/L   Calcium 9.2 8.6 -  10.2 mg/dL   Total Protein 6.5 6.0 - 8.5 g/dL   Albumin 4.6 3.6 - 4.8 g/dL   Globulin, Total 1.9 1.5 - 4.5 g/dL   Albumin/Globulin Ratio 2.4 (H) 1.2 - 2.2   Bilirubin Total 1.3 (H) 0.0 - 1.2 mg/dL   Alkaline Phosphatase 113 39 - 117 IU/L   AST 17 0 - 40 IU/L   ALT 12 0 - 44 IU/L  CBC with Differential/Platelet  Result Value Ref Range   WBC 6.5 3.4 - 10.8 x10E3/uL   RBC 5.61 4.14 - 5.80 x10E6/uL   Hemoglobin 15.5 13.0 - 17.7 g/dL   Hematocrit 45.2 37.5 - 51.0 %   MCV 81 79 - 97 fL   MCH 27.6 26.6 - 33.0 pg   MCHC 34.3 31.5 - 35.7 g/dL   RDW 13.7 12.3 - 15.4 %   Platelets 205 150 - 379 x10E3/uL   Neutrophils 69 Not Estab. %   Lymphs 22 Not Estab. %   Monocytes 7 Not Estab. %   Eos 2 Not Estab. %   Basos 0 Not Estab. %   Neutrophils Absolute 4.4 1.4 - 7.0 x10E3/uL   Lymphocytes Absolute  1.5 0.7 - 3.1 x10E3/uL   Monocytes Absolute 0.5 0.1 - 0.9 x10E3/uL   EOS (ABSOLUTE) 0.2 0.0 - 0.4 x10E3/uL   Basophils Absolute 0.0 0.0 - 0.2 x10E3/uL   Immature Granulocytes 0 Not Estab. %   Immature Grans (Abs) 0.0 0.0 - 0.1 x10E3/uL  Lipid panel  Result Value Ref Range   Cholesterol, Total 221 (H) 100 - 199 mg/dL   Triglycerides 118 0 - 149 mg/dL   HDL 40 >39 mg/dL   VLDL Cholesterol Cal 24 5 - 40 mg/dL   LDL Calculated 157 (H) 0 - 99 mg/dL   Chol/HDL Ratio 5.5 (H) 0.0 - 5.0 ratio  Hepatitis C antibody  Result Value Ref Range   Hep C Virus Ab <0.1 0.0 - 0.9 s/co ratio  HIV antibody  Result Value Ref Range   HIV Screen 4th Generation wRfx Non Reactive Non Reactive      Assessment & Plan:   Problem List Items Addressed This Visit      Cardiovascular and Mediastinum   Essential hypertension, benign - Primary    Continue current medication, blood pressure looks good  Follow up plan: Return in about 6 months (around 01/09/2018), or if symptoms worsen or fail to improve, for Hypertension and cholesterol recheck.  Counseling provided for all of the vaccine components No orders of the defined types were placed in this encounter.   Caryl Pina, MD [facility] 07/10/2017, 2:22 PM

## 2017-08-26 DIAGNOSIS — R531 Weakness: Secondary | ICD-10-CM | POA: Diagnosis not present

## 2018-05-26 ENCOUNTER — Other Ambulatory Visit: Payer: Self-pay | Admitting: Family Medicine

## 2018-05-26 DIAGNOSIS — I10 Essential (primary) hypertension: Secondary | ICD-10-CM

## 2018-07-05 ENCOUNTER — Encounter: Payer: Self-pay | Admitting: Family Medicine

## 2018-07-05 ENCOUNTER — Other Ambulatory Visit: Payer: Self-pay

## 2018-07-05 ENCOUNTER — Ambulatory Visit (INDEPENDENT_AMBULATORY_CARE_PROVIDER_SITE_OTHER): Payer: BLUE CROSS/BLUE SHIELD | Admitting: Family Medicine

## 2018-07-05 DIAGNOSIS — I10 Essential (primary) hypertension: Secondary | ICD-10-CM | POA: Diagnosis not present

## 2018-07-05 DIAGNOSIS — E7800 Pure hypercholesterolemia, unspecified: Secondary | ICD-10-CM

## 2018-07-05 MED ORDER — AMLODIPINE BESYLATE 10 MG PO TABS: 10.0000 mg | ORAL_TABLET | Freq: Every day | ORAL | 3 refills | Status: DC

## 2018-07-05 MED ORDER — ATORVASTATIN CALCIUM 40 MG PO TABS: 40.0000 mg | ORAL_TABLET | Freq: Every day | ORAL | 3 refills | Status: DC

## 2018-07-05 NOTE — Progress Notes (Signed)
Virtual Visit via telephone Note  I connected with Ovid Curd on 07/05/18 at 1422 by telephone and verified that I am speaking with the correct person using two identifiers. Fernando Cooper is currently located at home and no other people are currently with her during visit. The provider, Elige Radon Miche Loughridge, MD is located in their office at time of visit.  Call ended at 1430  I discussed the limitations, risks, security and privacy concerns of performing an evaluation and management service by telephone and the availability of in person appointments. I also discussed with the patient that there may be a patient responsible charge related to this service. The patient expressed understanding and agreed to proceed.   History and Present Illness: Hypertension Patient is currently on amlodipine, and their blood pressure today is 147/87. Patient denies any lightheadedness or dizziness. Patient denies headaches, blurred vision, chest pains, shortness of breath, or weakness. Denies any side effects from medication and is content with current medication.   Hyperlipidemia Patient is coming in for recheck of his hyperlipidemia. The patient is currently taking lipitor. They deny any issues with myalgias or history of liver damage from it. They deny any focal numbness or weakness or chest pain.   Outpatient Encounter Medications as of 07/05/2018  Medication Sig  . amLODipine (NORVASC) 10 MG tablet Take 1 tablet (10 mg total) by mouth daily. (Needs to be seen before next refill)  . atorvastatin (LIPITOR) 40 MG tablet Take 1 tablet (40 mg total) by mouth daily.   No facility-administered encounter medications on file as of 07/05/2018.     Review of Systems  Constitutional: Negative for chills and fever.  Eyes: Negative for visual disturbance.  Respiratory: Negative for shortness of breath and wheezing.   Cardiovascular: Negative for chest pain and leg swelling.  Musculoskeletal: Negative for back pain  and gait problem.  Skin: Negative for rash.  Neurological: Negative for dizziness, weakness and light-headedness.  All other systems reviewed and are negative.   Observations/Objective: Patient sounds comfortable on the phone and in no acute distress  Assessment and Plan: Problem List Items Addressed This Visit      Cardiovascular and Mediastinum   Essential hypertension, benign   Relevant Medications   amLODipine (NORVASC) 10 MG tablet   atorvastatin (LIPITOR) 40 MG tablet     Other   Hyperlipemia - Primary   Relevant Medications   amLODipine (NORVASC) 10 MG tablet   atorvastatin (LIPITOR) 40 MG tablet       Follow Up Instructions: Recommended for patient to follow-up in 6 to 8 months for a recheck and blood work He is feeling a lot better and feels like his blood pressures running a lot better.   I discussed the assessment and treatment plan with the patient. The patient was provided an opportunity to ask questions and all were answered. The patient agreed with the plan and demonstrated an understanding of the instructions.   The patient was advised to call back or seek an in-person evaluation if the symptoms worsen or if the condition fails to improve as anticipated.  The above assessment and management plan was discussed with the patient. The patient verbalized understanding of and has agreed to the management plan. Patient is aware to call the clinic if symptoms persist or worsen. Patient is aware when to return to the clinic for a follow-up visit. Patient educated on when it is appropriate to go to the emergency department.    I provided 8  minutes of non-face-to-face time during this encounter.    Worthy Rancher, MD

## 2019-06-29 ENCOUNTER — Other Ambulatory Visit: Payer: Self-pay | Admitting: Family Medicine

## 2019-06-29 DIAGNOSIS — I10 Essential (primary) hypertension: Secondary | ICD-10-CM

## 2019-07-23 DIAGNOSIS — L570 Actinic keratosis: Secondary | ICD-10-CM | POA: Diagnosis not present

## 2019-07-23 DIAGNOSIS — Z08 Encounter for follow-up examination after completed treatment for malignant neoplasm: Secondary | ICD-10-CM | POA: Diagnosis not present

## 2019-07-23 DIAGNOSIS — Z85828 Personal history of other malignant neoplasm of skin: Secondary | ICD-10-CM | POA: Diagnosis not present

## 2019-07-23 DIAGNOSIS — X32XXXD Exposure to sunlight, subsequent encounter: Secondary | ICD-10-CM | POA: Diagnosis not present

## 2019-08-19 ENCOUNTER — Telehealth: Payer: Self-pay | Admitting: Family Medicine

## 2019-08-19 DIAGNOSIS — I10 Essential (primary) hypertension: Secondary | ICD-10-CM

## 2019-08-19 MED ORDER — AMLODIPINE BESYLATE 10 MG PO TABS: 10.0000 mg | ORAL_TABLET | Freq: Every day | ORAL | 0 refills | Status: DC

## 2019-08-19 NOTE — Telephone Encounter (Signed)
°  Prescription Request  08/19/2019  What is the name of the medication or equipment? amLODipine (NORVASC) 10 MG tablet   Have you contacted your pharmacy to request a refill? (if applicable) YES  Which pharmacy would you like this sent to? [facility] pt has appt with Dr. Algis Downs on 09/13/19    Patient notified that their request is being sent to the clinical staff for review and that they should receive a response within 2 business days.

## 2019-08-19 NOTE — Telephone Encounter (Signed)
One month supply sent in for patient - left detailed message.

## 2019-09-13 ENCOUNTER — Other Ambulatory Visit: Payer: Self-pay

## 2019-09-13 ENCOUNTER — Encounter: Payer: Self-pay | Admitting: Family Medicine

## 2019-09-13 ENCOUNTER — Ambulatory Visit: Payer: Medicare Other | Admitting: Family Medicine

## 2019-09-13 VITALS — BP 136/75 | HR 80 | Temp 97.8°F | Ht 70.0 in | Wt 185.0 lb

## 2019-09-13 DIAGNOSIS — E7800 Pure hypercholesterolemia, unspecified: Secondary | ICD-10-CM | POA: Diagnosis not present

## 2019-09-13 DIAGNOSIS — Z125 Encounter for screening for malignant neoplasm of prostate: Secondary | ICD-10-CM | POA: Diagnosis not present

## 2019-09-13 DIAGNOSIS — I10 Essential (primary) hypertension: Secondary | ICD-10-CM | POA: Diagnosis not present

## 2019-09-13 MED ORDER — AMLODIPINE BESYLATE 10 MG PO TABS: 10.0000 mg | ORAL_TABLET | Freq: Every day | ORAL | 3 refills | Status: DC

## 2019-09-13 NOTE — Progress Notes (Signed)
BP 136/75   Pulse 80   Temp 97.8 F (36.6 C) (Temporal)   Ht '5\' 10"'  (1.778 m)   Wt 185 lb (83.9 kg)   BMI 26.54 kg/m    Subjective:   Patient ID: Fernando Cooper, male    DOB: 07-22-1955, 64 y.o.   MRN: [id number]  HPI: Fernando Cooper is a 64 y.o. male presenting on 09/13/2019 for Medical Management of Chronic Issues   HPI Hypertension Patient is currently on amlodipine, and their blood pressure today is 136/75 and he says at home it is running great as well. Patient denies any lightheadedness or dizziness. Patient denies headaches, blurred vision, chest pains, shortness of breath, or weakness. Denies any side effects from medication and is content with current medication.   Hyperlipidemia Patient is coming in for recheck of his hyperlipidemia. The patient is currently taking stopped the atorvastatin because he felt weird did not feel good on it, tried multiple times of the year and did not like it so was off of it currently.. They deny any issues with myalgias or history of liver damage from it. They deny any focal numbness or weakness or chest pain.   Relevant past medical, surgical, family and social history reviewed and updated as indicated. Interim medical history since our last visit reviewed. Allergies and medications reviewed and updated.  Review of Systems  Constitutional: Negative for chills and fever.  Eyes: Negative for visual disturbance.  Respiratory: Negative for shortness of breath and wheezing.   Cardiovascular: Negative for chest pain and leg swelling.  Musculoskeletal: Negative for back pain and gait problem.  Skin: Negative for rash.  Neurological: Negative for dizziness, weakness and light-headedness.  All other systems reviewed and are negative.   Per HPI unless specifically indicated above   Allergies as of 09/13/2019   No Known Allergies     Medication List       Accurate as of September 13, 2019  3:05 PM. If you have any questions, ask your nurse or  doctor.        STOP taking these medications   atorvastatin 40 MG tablet Commonly known as: LIPITOR Stopped by: Fransisca Kaufmann Timber Lucarelli, MD     TAKE these medications   amLODipine 10 MG tablet Commonly known as: NORVASC Take 1 tablet (10 mg total) by mouth daily. (Needs to be seen before next refill)        Objective:   BP 136/75   Pulse 80   Temp 97.8 F (36.6 C) (Temporal)   Ht '5\' 10"'  (1.778 m)   Wt 185 lb (83.9 kg)   BMI 26.54 kg/m   Wt Readings from Last 3 Encounters:  09/13/19 185 lb (83.9 kg)  07/10/17 186 lb (84.4 kg)  06/09/17 186 lb (84.4 kg)    Physical Exam Vitals and nursing note reviewed.  Constitutional:      General: He is not in acute distress.    Appearance: He is well-developed. He is not diaphoretic.  Eyes:     General: No scleral icterus.    Conjunctiva/sclera: Conjunctivae normal.  Neck:     Thyroid: No thyromegaly.  Cardiovascular:     Rate and Rhythm: Normal rate and regular rhythm.     Heart sounds: Normal heart sounds. No murmur heard.   Pulmonary:     Effort: Pulmonary effort is normal. No respiratory distress.     Breath sounds: Normal breath sounds. No wheezing.  Musculoskeletal:        General:  Normal range of motion.     Cervical back: Neck supple.  Lymphadenopathy:     Cervical: No cervical adenopathy.  Skin:    General: Skin is warm and dry.     Findings: No rash.  Neurological:     Mental Status: He is alert and oriented to person, place, and time.     Coordination: Coordination normal.  Psychiatric:        Behavior: Behavior normal.       Assessment & Plan:   Problem List Items Addressed This Visit      Cardiovascular and Mediastinum   Essential hypertension, benign   Relevant Orders   CBC with Differential/Platelet   CMP14+EGFR     Other   Hyperlipemia - Primary   Relevant Orders   CBC with Differential/Platelet   Lipid panel    Other Visit Diagnoses    Prostate cancer screening       Relevant Orders     PSA, total and free      Continue amlodipine, will check cholesterol levels and then will decide from there if we need to discuss other cholesterol pills besides atorvastatin but will check levels first. Follow up plan: Return in about 1 year (around 09/12/2020), or if symptoms worsen or fail to improve, for Hypertension and cholesterol recheck.  Counseling provided for all of the vaccine components Orders Placed This Encounter  Procedures  . CBC with Differential/Platelet  . CMP14+EGFR  . Lipid panel  . PSA, total and free    Caryl Pina, MD [facility] 09/13/2019, 3:05 PM

## 2019-09-13 NOTE — Addendum Note (Signed)
Addended by: Arville Care on: 09/13/2019 03:08 PM   Modules accepted: Orders

## 2019-09-14 LAB — PSA, TOTAL AND FREE
PSA, Free Pct: 30 %
PSA, Free: 0.36 ng/mL
Prostate Specific Ag, Serum: 1.2 ng/mL (ref 0.0–4.0)

## 2019-09-14 LAB — CBC WITH DIFFERENTIAL/PLATELET
Basophils Absolute: 0 10*3/uL (ref 0.0–0.2)
Basos: 0 %
EOS (ABSOLUTE): 0.2 10*3/uL (ref 0.0–0.4)
Eos: 2 %
Hematocrit: 45.5 % (ref 37.5–51.0)
Hemoglobin: 15.3 g/dL (ref 13.0–17.7)
Immature Grans (Abs): 0 10*3/uL (ref 0.0–0.1)
Immature Granulocytes: 0 %
Lymphocytes Absolute: 2 10*3/uL (ref 0.7–3.1)
Lymphs: 20 %
MCH: 27.8 pg (ref 26.6–33.0)
MCHC: 33.6 g/dL (ref 31.5–35.7)
MCV: 83 fL (ref 79–97)
Monocytes Absolute: 0.5 10*3/uL (ref 0.1–0.9)
Monocytes: 5 %
Neutrophils Absolute: 7.2 10*3/uL — ABNORMAL HIGH (ref 1.4–7.0)
Neutrophils: 73 %
Platelets: 250 10*3/uL (ref 150–450)
RBC: 5.5 x10E6/uL (ref 4.14–5.80)
RDW: 13.4 % (ref 11.6–15.4)
WBC: 10 10*3/uL (ref 3.4–10.8)

## 2019-09-14 LAB — CMP14+EGFR
ALT: 12 IU/L (ref 0–44)
AST: 16 IU/L (ref 0–40)
Albumin/Globulin Ratio: 1.9 (ref 1.2–2.2)
Albumin: 4.4 g/dL (ref 3.8–4.8)
Alkaline Phosphatase: 123 IU/L — ABNORMAL HIGH (ref 48–121)
BUN/Creatinine Ratio: 8 — ABNORMAL LOW (ref 10–24)
BUN: 9 mg/dL (ref 8–27)
Bilirubin Total: 1.1 mg/dL (ref 0.0–1.2)
CO2: 21 mmol/L (ref 20–29)
Calcium: 9.1 mg/dL (ref 8.6–10.2)
Chloride: 102 mmol/L (ref 96–106)
Creatinine, Ser: 1.14 mg/dL (ref 0.76–1.27)
GFR calc Af Amer: 78 mL/min/{1.73_m2} (ref >59)
GFR calc non Af Amer: 67 mL/min/{1.73_m2} (ref >59)
Globulin, Total: 2.3 g/dL (ref 1.5–4.5)
Glucose: 100 mg/dL — ABNORMAL HIGH (ref 65–99)
Potassium: 4.1 mmol/L (ref 3.5–5.2)
Sodium: 138 mmol/L (ref 134–144)
Total Protein: 6.7 g/dL (ref 6.0–8.5)

## 2019-09-14 LAB — LIPID PANEL
Chol/HDL Ratio: 6.9 ratio — ABNORMAL HIGH (ref 0.0–5.0)
Cholesterol, Total: 213 mg/dL — ABNORMAL HIGH (ref 100–199)
HDL: 31 mg/dL — ABNORMAL LOW (ref >39)
LDL Chol Calc (NIH): 108 mg/dL — ABNORMAL HIGH (ref 0–99)
Triglycerides: 432 mg/dL — ABNORMAL HIGH (ref 0–149)
VLDL Cholesterol Cal: 74 mg/dL — ABNORMAL HIGH (ref 5–40)

## 2019-09-18 ENCOUNTER — Telehealth: Payer: Self-pay | Admitting: Family Medicine

## 2019-09-18 MED ORDER — PRAVASTATIN SODIUM 20 MG PO TABS: 20.0000 mg | ORAL_TABLET | Freq: Every day | ORAL | 3 refills | Status: DC

## 2019-09-18 NOTE — Telephone Encounter (Signed)
Patient aware, per message left on voice mail,   script is ready. 

## 2019-09-18 NOTE — Telephone Encounter (Signed)
Sent in pravastatin for him, this 1 should not cause the muscle aches like he had with the Lipitor.

## 2019-09-18 NOTE — Telephone Encounter (Signed)
Patient was last seen on 09/13/2019 and had labs done at that time to include lipids.  Patient's wife called patient wants to start medication but does not want generic Lipitor as he has taken in the past and it caused leg pains. Please review and advise.

## 2019-09-19 ENCOUNTER — Other Ambulatory Visit: Payer: Self-pay | Admitting: Family Medicine

## 2019-09-19 DIAGNOSIS — E7800 Pure hypercholesterolemia, unspecified: Secondary | ICD-10-CM

## 2019-09-20 MED ORDER — PRAVASTATIN SODIUM 20 MG PO TABS: 20.0000 mg | ORAL_TABLET | Freq: Every day | ORAL | 3 refills | Status: DC

## 2019-09-20 NOTE — Telephone Encounter (Signed)
Medication sent to wrong pharmacy.  Resent to [facility]

## 2020-01-21 DIAGNOSIS — Z08 Encounter for follow-up examination after completed treatment for malignant neoplasm: Secondary | ICD-10-CM | POA: Diagnosis not present

## 2020-01-21 DIAGNOSIS — L570 Actinic keratosis: Secondary | ICD-10-CM | POA: Diagnosis not present

## 2020-01-21 DIAGNOSIS — C44519 Basal cell carcinoma of skin of other part of trunk: Secondary | ICD-10-CM | POA: Diagnosis not present

## 2020-01-21 DIAGNOSIS — L821 Other seborrheic keratosis: Secondary | ICD-10-CM | POA: Diagnosis not present

## 2020-01-21 DIAGNOSIS — Z85828 Personal history of other malignant neoplasm of skin: Secondary | ICD-10-CM | POA: Diagnosis not present

## 2020-01-21 DIAGNOSIS — X32XXXD Exposure to sunlight, subsequent encounter: Secondary | ICD-10-CM | POA: Diagnosis not present

## 2020-07-25 ENCOUNTER — Other Ambulatory Visit: Payer: Self-pay

## 2020-07-25 ENCOUNTER — Inpatient Hospital Stay (HOSPITAL_COMMUNITY)
Admission: EM | Admit: 2020-07-25 | Discharge: 2020-07-27 | DRG: 064 | Disposition: A | Discharge status: Home / Self Care | Payer: Medicare Other | Attending: Internal Medicine | Admitting: Internal Medicine

## 2020-07-25 ENCOUNTER — Encounter (HOSPITAL_COMMUNITY): Payer: Self-pay | Admitting: Emergency Medicine

## 2020-07-25 ENCOUNTER — Emergency Department (HOSPITAL_COMMUNITY): Payer: Medicare Other

## 2020-07-25 DIAGNOSIS — I69351 Hemiplegia and hemiparesis following cerebral infarction affecting right dominant side: Secondary | ICD-10-CM | POA: Diagnosis not present

## 2020-07-25 DIAGNOSIS — I10 Essential (primary) hypertension: Secondary | ICD-10-CM | POA: Diagnosis not present

## 2020-07-25 DIAGNOSIS — E7800 Pure hypercholesterolemia, unspecified: Secondary | ICD-10-CM

## 2020-07-25 DIAGNOSIS — Z79899 Other long term (current) drug therapy: Secondary | ICD-10-CM | POA: Diagnosis not present

## 2020-07-25 DIAGNOSIS — E876 Hypokalemia: Secondary | ICD-10-CM | POA: Diagnosis not present

## 2020-07-25 DIAGNOSIS — R200 Anesthesia of skin: Secondary | ICD-10-CM

## 2020-07-25 DIAGNOSIS — R0602 Shortness of breath: Secondary | ICD-10-CM

## 2020-07-25 DIAGNOSIS — I639 Cerebral infarction, unspecified: Secondary | ICD-10-CM | POA: Diagnosis not present

## 2020-07-25 DIAGNOSIS — R29898 Other symptoms and signs involving the musculoskeletal system: Secondary | ICD-10-CM | POA: Diagnosis present

## 2020-07-25 DIAGNOSIS — I63511 Cerebral infarction due to unspecified occlusion or stenosis of right middle cerebral artery: Secondary | ICD-10-CM | POA: Diagnosis not present

## 2020-07-25 DIAGNOSIS — E785 Hyperlipidemia, unspecified: Secondary | ICD-10-CM | POA: Diagnosis present

## 2020-07-25 DIAGNOSIS — R29818 Other symptoms and signs involving the nervous system: Secondary | ICD-10-CM | POA: Diagnosis not present

## 2020-07-25 DIAGNOSIS — I63412 Cerebral infarction due to embolism of left middle cerebral artery: Secondary | ICD-10-CM | POA: Diagnosis not present

## 2020-07-25 DIAGNOSIS — Z28310 Unvaccinated for covid-19: Secondary | ICD-10-CM

## 2020-07-25 DIAGNOSIS — R29701 NIHSS score 1: Secondary | ICD-10-CM | POA: Diagnosis present

## 2020-07-25 DIAGNOSIS — Z8249 Family history of ischemic heart disease and other diseases of the circulatory system: Secondary | ICD-10-CM

## 2020-07-25 DIAGNOSIS — U071 COVID-19: Secondary | ICD-10-CM | POA: Diagnosis not present

## 2020-07-25 DIAGNOSIS — I6623 Occlusion and stenosis of bilateral posterior cerebral arteries: Secondary | ICD-10-CM | POA: Diagnosis not present

## 2020-07-25 DIAGNOSIS — I6340 Cerebral infarction due to embolism of unspecified cerebral artery: Secondary | ICD-10-CM | POA: Insufficient documentation

## 2020-07-25 DIAGNOSIS — I6389 Other cerebral infarction: Secondary | ICD-10-CM | POA: Diagnosis not present

## 2020-07-25 LAB — URINALYSIS, ROUTINE W REFLEX MICROSCOPIC
Bilirubin Urine: NEGATIVE
Glucose, UA: NEGATIVE mg/dL
Hgb urine dipstick: NEGATIVE
Ketones, ur: NEGATIVE mg/dL
Leukocytes,Ua: NEGATIVE
Nitrite: NEGATIVE
Protein, ur: NEGATIVE mg/dL
Specific Gravity, Urine: 1.009 (ref 1.005–1.030)
pH: 7 (ref 5.0–8.0)

## 2020-07-25 LAB — RAPID URINE DRUG SCREEN, HOSP PERFORMED
Amphetamines: NOT DETECTED
Barbiturates: NOT DETECTED
Benzodiazepines: NOT DETECTED
Cocaine: NOT DETECTED
Opiates: NOT DETECTED
Tetrahydrocannabinol: NOT DETECTED

## 2020-07-25 LAB — DIFFERENTIAL
Abs Immature Granulocytes: 0.06 10*3/uL (ref 0.00–0.07)
Basophils Absolute: 0 10*3/uL (ref 0.0–0.1)
Basophils Relative: 0 %
Eosinophils Absolute: 0.1 10*3/uL (ref 0.0–0.5)
Eosinophils Relative: 1 %
Immature Granulocytes: 1 %
Lymphocytes Relative: 22 %
Lymphs Abs: 1.7 10*3/uL (ref 0.7–4.0)
Monocytes Absolute: 0.5 10*3/uL (ref 0.1–1.0)
Monocytes Relative: 6 %
Neutro Abs: 5.5 10*3/uL (ref 1.7–7.7)
Neutrophils Relative %: 70 %

## 2020-07-25 LAB — COMPREHENSIVE METABOLIC PANEL
ALT: 38 U/L (ref 0–44)
AST: 23 U/L (ref 15–41)
Albumin: 4 g/dL (ref 3.5–5.0)
Alkaline Phosphatase: 90 U/L (ref 38–126)
Anion gap: 9 (ref 5–15)
BUN: 9 mg/dL (ref 8–23)
CO2: 25 mmol/L (ref 22–32)
Calcium: 8.8 mg/dL — ABNORMAL LOW (ref 8.9–10.3)
Chloride: 102 mmol/L (ref 98–111)
Creatinine, Ser: 0.99 mg/dL (ref 0.61–1.24)
GFR, Estimated: >60 mL/min (ref >60)
Glucose, Bld: 102 mg/dL — ABNORMAL HIGH (ref 70–99)
Potassium: 3.2 mmol/L — ABNORMAL LOW (ref 3.5–5.1)
Sodium: 136 mmol/L (ref 135–145)
Total Bilirubin: 1.1 mg/dL (ref 0.3–1.2)
Total Protein: 6.9 g/dL (ref 6.5–8.1)

## 2020-07-25 LAB — APTT: aPTT: 29 seconds (ref 24–36)

## 2020-07-25 LAB — CBC
HCT: 41.6 % (ref 39.0–52.0)
Hemoglobin: 14.7 g/dL (ref 13.0–17.0)
MCH: 28.4 pg (ref 26.0–34.0)
MCHC: 35.3 g/dL (ref 30.0–36.0)
MCV: 80.3 fL (ref 80.0–100.0)
Platelets: 313 10*3/uL (ref 150–400)
RBC: 5.18 MIL/uL (ref 4.22–5.81)
RDW: 12.6 % (ref 11.5–15.5)
WBC: 7.9 10*3/uL (ref 4.0–10.5)
nRBC: 0 % (ref 0.0–0.2)

## 2020-07-25 LAB — RESP PANEL BY RT-PCR (FLU A&B, COVID) ARPGX2
Influenza A by PCR: NEGATIVE
Influenza B by PCR: NEGATIVE
SARS Coronavirus 2 by RT PCR: POSITIVE — AB

## 2020-07-25 LAB — PROTIME-INR
INR: 1 (ref 0.8–1.2)
Prothrombin Time: 12.9 seconds (ref 11.4–15.2)

## 2020-07-25 LAB — ETHANOL: Alcohol, Ethyl (B): <10 mg/dL (ref <10)

## 2020-07-25 LAB — CBG MONITORING, ED: Glucose-Capillary: 104 mg/dL — ABNORMAL HIGH (ref 70–99)

## 2020-07-25 MED ORDER — ASPIRIN 325 MG PO TABS
325.0000 mg | ORAL_TABLET | Freq: Once | ORAL | Status: AC
  Administered 2020-07-25: 325 mg via ORAL
  Filled 2020-07-25: qty 1

## 2020-07-25 NOTE — Progress Notes (Signed)
[facility]   Date of Service:   07/25/2020 20:33:13  Diagnosis:     .  I63.00 - Cerebrovascular accident (CVA) due to thrombosis of precerebral artery (HCCC)  Impression:     . Patient with history of hypertension who presents with right hand numbness and weakness. Possible left hemispheric stroke vs peripheral nerve lesion/radiculopathy. Admit for further workup.  Metrics: Last Known Well: 07/25/2020 12:00:00 [facility] Notification Time: 07/25/2020 20:33:13 Arrival Time: 07/25/2020 18:53:00 Stamp Time: 07/25/2020 20:33:13 Initial Response Time: 07/25/2020 20:49:43 Symptoms: right hand numbness. NIHSS Start Assessment Time: 07/25/2020 20:51:20 Patient is not a candidate for Thrombolytic. Thrombolytic Medical Decision: 07/25/2020 20:51:16 Patient was not deemed candidate for Thrombolytic because of following reasons: Last Well Known Above 4.5 Hours.  CT head showed no acute hemorrhage or acute core infarct. CT head was reviewed.  ED Physician notified of diagnostic impression and management plan on 07/25/2020 21:02:29  Advanced Imaging: Advanced Imaging Not Recommended because:  Clinical presentation is not suggestion of LVO or Low clinical suspicion of LVO based on presentation   Our recommendations are outlined below.  Recommendations:      .  Stroke/Telemetry Floor     .  Neuro Checks     .  Bedside Swallow Eval     .  DVT Prophylaxis     .  IV Fluids, Normal Saline     .  Head of Bed 30 Degrees     .  Euglycemia and Avoid Hyperthermia (PRN Acetaminophen)     .  Initiate Aspirin 81 MG Daily     .  Antihypertensives PRN if Blood pressure is greater than 220/120 or there is a concern for End organ damage/contraindications for permissive HTN. If blood pressure is greater than 220/120 give labetalol PO or IV or Vasotec IV with a goal of 15% reduction in BP during the first 24 hours.     Marland Kitchen  MRI brain     .  2d  echo     .  CUS     .  check lipid panel and HgA1C  Routine Consultation with Inhouse Neurology for Follow up Care  Sign Out:     .  Discussed with Emergency Department Provider    ------------------------------------------------------------------------------  History of Present Illness: Patient is a 65 year old Male.  Patient was brought by EMS for symptoms of right hand numbness.  Patient with history of hypertension presents with right hand numbness since noon today. Since arriving to the ER he has more pronounced right hand weakness as well in grip and extension. No weakness, no trouble speaking, no vision or speech deficits. no prior stroke history. No trauma, no neck pain  Last seen normal was beyond 4.5 hours of presentation.  Past Medical History:     . Hypertension     . There is NO history of Diabetes Mellitus     . There is NO history of Hyperlipidemia     . There is NO history of Atrial Fibrillation     . There is NO history of Coronary Artery Disease     . There is NO history of Stroke  Social History: Smoking: No Alcohol Use: No Drug Use: No  Review of System:  14 Points Review of Systems was performed and was negative except mentioned in HPI.  Anticoagulant use:  No  Antiplatelet use: No  Allergies:  Reviewed    Examination: BP(152/90), Pulse(79), Blood Glucose(104) 1A: Level of Consciousness - Alert; keenly  responsive + 0 1B: Ask Month and Age - Both Questions Right + 0 1C: Blink Eyes & Squeeze Hands - Performs Both Tasks + 0 2: Test Horizontal Extraocular Movements - Normal + 0 3: Test Visual Fields - No Visual Loss + 0 4: Test Facial Palsy (Use Grimace if Obtunded) - Normal symmetry + 0 5A: Test Left Arm Motor Drift - No Drift for 10 Seconds + 0 5B: Test Right Arm Motor Drift - Drift, but doesn't hit bed + 1 6A: Test Left Leg Motor Drift - No Drift for 5 Seconds + 0 6B: Test Right Leg Motor Drift - No Drift for 5 Seconds + 0 7: Test Limb  Ataxia (FNF/Heel-Shin) - No Ataxia + 0 8: Test Sensation - Normal; No sensory loss + 0 9: Test Language/Aphasia - Normal; No aphasia + 0 10: Test Dysarthria - Normal + 0 11: Test Extinction/Inattention - No abnormality + 0  NIHSS Score: 1   Pre-Morbid Modified Rankin Scale: 0 Points = No symptoms at all   Patient/Family was informed the Neurology Consult would occur via TeleHealth consult by way of interactive audio and video telecommunications and consented to receiving care in this manner.   Patient is being evaluated for possible acute neurologic impairment and high probability of imminent or life-threatening deterioration. I spent total of 30 minutes providing care to this patient, including time for face to face visit via telemedicine, review of medical records, imaging studies and discussion of findings with providers, the patient and/or family.   Dr Natasha Bence Jyair Kiraly   [facility] [phone]  Case [id number]

## 2020-07-25 NOTE — ED Notes (Signed)
Pt updated on POC, denies questions, verbalizes understanding. Pt's spouse left her contact information, offered to call for pt, he attempted to call himself phone went to voicemail he will attempt at a later time. Pt up to bathroom, steady gait independently noted, requested urine sample.

## 2020-07-25 NOTE — ED Notes (Signed)
Date and time results received: 07/25/20 2108  Test: covid, flu a&b Critical Value: POSITIVE covid   Name of Provider Notified: Dr. Para Skeans, T. Triplett,PA-C  Orders Received? Or Actions Taken?: No new orders at this time

## 2020-07-25 NOTE — ED Provider Notes (Signed)
[facility] EMERGENCY DEPARTMENT Provider Note   CSN: [id number] Arrival date & time: 07/25/20  1853     History Chief Complaint  Patient presents with  . Numbness    Fernando Cooper is a 65 y.o. male.  HPI      Fernando Cooper is a 65 y.o. male with past medical history of hypertension hyperlipidemia who presents to the Emergency Department complaining of gradually worsening weakness, numbness and tingling of the right hand.  Symptoms began at noon today.  Describes noticing a tingling sensation to his fingers that gradually worsened and now has difficulty extending his fingers or gripping objects with the right hand.  Denies known injury or excessive use of his right hand.  He is right-hand dominant.  States that his right arm feels slightly heavy, but most of his symptoms are to his hand.  Denies any numbness or weakness of his lower extremities or face.  He also denies any headache, dizziness, visual changes, changes in speech or recall.  He is a non-smoker.  No history of prior CVA or TIA.  Does not take blood thinners or aspirin.    Past Medical History:  Diagnosis Date  . Hyperlipidemia   . Hypertension     Patient Active Problem List   Diagnosis Date Noted  . Essential hypertension, benign 03/17/2015  . Hyperlipemia 03/17/2015    Past Surgical History:  Procedure Laterality Date  . NOSE SURGERY         Family History  Problem Relation Age of Onset  . Heart disease Mother   . Heart disease Father   . Cancer Sister   . Diabetes Brother     Social History   Tobacco Use  . Smoking status: Never Smoker  . Smokeless tobacco: Never Used  Vaping Use  . Vaping Use: Never used  Substance Use Topics  . Alcohol use: No    Alcohol/week: 0.0 standard drinks  . Drug use: No    Home Medications Prior to Admission medications   Medication Sig Start Date End Date Taking? Authorizing Provider  amLODipine (NORVASC) 10 MG tablet Take 1 tablet (10 mg total) by mouth  daily. 09/13/19   Dettinger, Elige Radon, MD  pravastatin (PRAVACHOL) 20 MG tablet Take 1 tablet (20 mg total) by mouth daily. 09/20/19   Dettinger, Elige Radon, MD    Allergies    Patient has no known allergies.  Review of Systems   Review of Systems  Constitutional: Negative for chills, fatigue and fever.  HENT: Negative for sore throat and trouble swallowing.   Eyes: Negative for visual disturbance.  Respiratory: Negative for shortness of breath and wheezing.   Cardiovascular: Negative for chest pain.  Gastrointestinal: Negative for abdominal pain, nausea and vomiting.  Genitourinary: Negative for dysuria, flank pain and hematuria.  Musculoskeletal: Negative for arthralgias, back pain, myalgias, neck pain and neck stiffness.  Skin: Negative for rash.  Neurological: Positive for weakness and numbness (Numbness and weakness of the right hand). Negative for dizziness, tremors, syncope, facial asymmetry, speech difficulty and headaches.  Hematological: Does not bruise/bleed easily.  Psychiatric/Behavioral: Negative for confusion and decreased concentration.    Physical Exam Updated Vital Signs BP (!) 152/90   Pulse 79   Temp 98 F (36.7 C)   Resp 18   Ht 5\' 10"  (1.778 m)   Wt 83.9 kg   SpO2 100%   BMI 26.54 kg/m   Physical Exam Vitals and nursing note reviewed.  Constitutional:  General: He is not in acute distress.    Appearance: Normal appearance. He is not ill-appearing.  HENT:     Mouth/Throat:     Mouth: Mucous membranes are moist.  Eyes:     Extraocular Movements: Extraocular movements intact.     Conjunctiva/sclera: Conjunctivae normal.     Pupils: Pupils are equal, round, and reactive to light.  Cardiovascular:     Rate and Rhythm: Normal rate and regular rhythm.     Pulses: Normal pulses.  Pulmonary:     Effort: Pulmonary effort is normal.     Breath sounds: Normal breath sounds.  Abdominal:     Palpations: Abdomen is soft.     Tenderness: There is no  abdominal tenderness.  Musculoskeletal:        General: No tenderness or signs of injury.     Cervical back: Normal range of motion. No rigidity or tenderness.  Skin:    General: Skin is warm.  Neurological:     Mental Status: He is alert.     GCS: GCS eye subscore is 4. GCS verbal subscore is 5. GCS motor subscore is 6.     Sensory: Sensation is intact.     Motor: Motor function is intact.     Coordination: Coordination is intact.     Comments: Speech clear.  No facial droop, no pronator drift.  Grip significantly diminished on the right.  Patient has difficulty with right-sided finger-nose testing, unable to extend fingers of the right hand.     ED Results / Procedures / Treatments   Labs (all labs ordered are listed, but only abnormal results are displayed) Labs Reviewed  RESP PANEL BY RT-PCR (FLU A&B, COVID) ARPGX2 - Abnormal; Notable for the following components:      Result Value   SARS Coronavirus 2 by RT PCR POSITIVE (*)    All other components within normal limits  COMPREHENSIVE METABOLIC PANEL - Abnormal; Notable for the following components:   Potassium 3.2 (*)    Glucose, Bld 102 (*)    Calcium 8.8 (*)    All other components within normal limits  CBG MONITORING, ED - Abnormal; Notable for the following components:   Glucose-Capillary 104 (*)    All other components within normal limits  ETHANOL  PROTIME-INR  APTT  CBC  DIFFERENTIAL  RAPID URINE DRUG SCREEN, HOSP PERFORMED  URINALYSIS, ROUTINE W REFLEX MICROSCOPIC    EKG None  Radiology CT HEAD CODE STROKE WO CONTRAST  Result Date: 07/25/2020 CLINICAL DATA:  Code stroke.  Right hand numbness and weakness EXAM: CT HEAD WITHOUT CONTRAST TECHNIQUE: Contiguous axial images were obtained from the base of the skull through the vertex without intravenous contrast. COMPARISON:  None. FINDINGS: Brain: There is no mass, hemorrhage or extra-axial collection. The size and configuration of the ventricles and extra-axial  CSF spaces are normal. The brain parenchyma is normal, without evidence of acute or chronic infarction. Vascular: No abnormal hyperdensity of the major intracranial arteries or dural venous sinuses. No intracranial atherosclerosis. Skull: The visualized skull base, calvarium and extracranial soft tissues are normal. Sinuses/Orbits: No fluid levels or advanced mucosal thickening of the visualized paranasal sinuses. No mastoid or middle ear effusion. The orbits are normal. ASPECTS [facility] Stroke Program Early CT Score) - Ganglionic level infarction (caudate, lentiform nuclei, internal capsule, insula, M1-M3 cortex): 7 - Supraganglionic infarction (M4-M6 cortex): 3 Total score (0-10 with 10 being normal): 10 IMPRESSION: 1. Normal head CT. 2. ASPECTS is 10. These results were called  by telephone at the time of interpretation on 07/25/2020 at 8:10 pm to provider [facility], who verbally acknowledged these results. Electronically Signed   By: Deatra Robinson M.D.   On: 07/25/2020 20:12    Procedures Procedures   Medications Ordered in ED Medications - No data to display  ED Course  I have reviewed the triage vital signs and the nursing notes.  Pertinent labs & imaging results that were available during my care of the patient were reviewed by me and considered in my medical decision making (see chart for details).    MDM Rules/Calculators/A&P                          Patient here with focal deficit to right hand/arm.  Last known well 12 noon today.  No history of CVA or TIA. Doubt LVO. Does not take anticoagulants.  Code stroke activated.  CT head without acute finding.  We will proceed with teleneurology consult  Tele neurology recommends ASA here and admit for MRI.  Since MRI capability unavailable here on weekend, pt will need hospitalist to admit for transfer to [facility].    On further history from the patient, he had a positive COVID home test 2 weeks ago.  COVID PCR test today positive.  Patient  without symptoms, this may be residual positive result.   Final Clinical Impression(s) / ED Diagnoses Final diagnoses:  Numbness of right hand    Rx / DC Orders ED Discharge Orders    None       Rosey Bath 07/25/20 2246    Jacalyn Lefevre, MD 07/26/20 1557

## 2020-07-25 NOTE — ED Notes (Signed)
Tele-stroke cart at bedside, with Heather,RN .

## 2020-07-25 NOTE — ED Triage Notes (Signed)
Pt c/o right hand numbness and weakness that started after lunch today.

## 2020-07-25 NOTE — H&P (Signed)
History and Physical  Fernando Cooper DVV:[id number] DOB: 31-Jul-1955 DOA: 07/25/2020  Referring physician: Pauline Aus, PA-C, EDP PCP: Dettinger, Elige Radon, MD  Outpatient Specialists: none  Patient Coming From: home  Chief Complaint: right hand weakness  HPI: Fernando Cooper is a 65 y.o. male with a history of hypertension, hyperlipidemia.  Patient presents with right hand weakness that started around 12.  Symptoms started as tingling in right hand which progressed to weakness.  As his symptoms are worsening, the patient presented to the hospital for evaluation.  Symptom onset greater than 3 hours from time of presentation.  No palliating or provoking factors.  Emergency Department Course: Code stroke called.  Normal head CT.  Blood pressure is mildly elevated.  Neuro consult recommended admission for stroke work-up.  Review of Systems:   Pt denies any fevers, chills, nausea, vomiting, diarrhea, constipation, abdominal pain, shortness of breath, dyspnea on exertion, orthopnea, cough, wheezing, palpitations, headache, vision changes, lightheadedness, dizziness, melena, rectal bleeding.  Review of systems are otherwise negative  Past Medical History:  Diagnosis Date  . Hyperlipidemia   . Hypertension    Past Surgical History:  Procedure Laterality Date  . NOSE SURGERY     Social History:  reports that he has never smoked. He has never used smokeless tobacco. He reports that he does not drink alcohol and does not use drugs. Patient lives at home  No Known Allergies  Family History  Problem Relation Age of Onset  . Heart disease Mother   . Heart disease Father   . Cancer Sister   . Diabetes Brother       Prior to Admission medications   Medication Sig Start Date End Date Taking? Authorizing Provider  amLODipine (NORVASC) 10 MG tablet Take 1 tablet (10 mg total) by mouth daily. 09/13/19  Yes Dettinger, Elige Radon, MD  pravastatin (PRAVACHOL) 20 MG tablet Take 1 tablet (20 mg  total) by mouth daily. 09/20/19  Yes Dettinger, Elige Radon, MD    Physical Exam: BP (!) 151/89   Pulse 68   Temp 98 F (36.7 C)   Resp 15   Ht 5\' 10"  (1.778 m)   Wt 83.9 kg   SpO2 99%   BMI 26.54 kg/m   . General: Older male. Awake and alert and oriented x3. No acute cardiopulmonary distress.  HEENT: Normocephalic atraumatic.  Right and left ears normal in appearance.  Pupils equal, round, reactive to light. Extraocular muscles are intact. Sclerae anicteric and noninjected.  Moist mucosal membranes. No mucosal lesions.  . Neck: Neck supple without lymphadenopathy. No carotid bruits. No masses palpated.  . Cardiovascular: Regular rate with normal S1-S2 sounds. No murmurs, rubs, gallops auscultated. No JVD.  Marland Kitchen Respiratory: Good respiratory effort with no wheezes, rales, rhonchi. Lungs clear to auscultation bilaterally.  No accessory muscle use. . Abdomen: Soft, nontender, nondistended. Active bowel sounds. No masses or hepatosplenomegaly  . Skin: No rashes, lesions, or ulcerations.  Dry, warm to touch. 2+ dorsalis pedis and radial pulses. . Musculoskeletal: No calf or leg pain. All major joints not erythematous nontender.  No upper or lower joint deformation.  Good ROM.  No contractures  . Psychiatric: Intact judgment and insight. Pleasant and cooperative. . Neurologic: Strength globally diminished in right hand.  Diminished coordination with difficulty moving fingers, opposition of thumb, and pincer grasp.  Cranial nerves II through XII are grossly intact.  Lower extremity strength 5 out of 5 and symmetrical.  Labs on Admission: I have personally reviewed following labs and imaging studies  CBC: Recent Labs  Lab 07/25/20 1948  WBC 7.9  NEUTROABS 5.5  HGB 14.7  HCT 41.6  MCV 80.3  PLT 313   Basic Metabolic Panel: Recent Labs  Lab 07/25/20 1948  NA 136  K 3.2*  CL 102  CO2 25  GLUCOSE 102*  BUN 9  CREATININE 0.99  CALCIUM 8.8*   GFR: Estimated Creatinine  Clearance: 75.8 mL/min (by C-G formula based on SCr of 0.99 mg/dL). Liver Function Tests: Recent Labs  Lab 07/25/20 1948  AST 23  ALT 38  ALKPHOS 90  BILITOT 1.1  PROT 6.9  ALBUMIN 4.0   No results for input(s): LIPASE, AMYLASE in the last 168 hours. No results for input(s): AMMONIA in the last 168 hours. Coagulation Profile: Recent Labs  Lab 07/25/20 1948  INR 1.0   Cardiac Enzymes: No results for input(s): CKTOTAL, CKMB, CKMBINDEX, TROPONINI in the last 168 hours. BNP (last 3 results) No results for input(s): PROBNP in the last 8760 hours. HbA1C: No results for input(s): HGBA1C in the last 72 hours. CBG: Recent Labs  Lab 07/25/20 1930  GLUCAP 104*   Lipid Profile: No results for input(s): CHOL, HDL, LDLCALC, TRIG, CHOLHDL, LDLDIRECT in the last 72 hours. Thyroid Function Tests: No results for input(s): TSH, T4TOTAL, FREET4, T3FREE, THYROIDAB in the last 72 hours. Anemia Panel: No results for input(s): VITAMINB12, FOLATE, FERRITIN, TIBC, IRON, RETICCTPCT in the last 72 hours. Urine analysis: No results found for: COLORURINE, APPEARANCEUR, LABSPEC, PHURINE, GLUCOSEU, HGBUR, BILIRUBINUR, KETONESUR, PROTEINUR, UROBILINOGEN, NITRITE, LEUKOCYTESUR Sepsis Labs: @LABRCNTIP (procalcitonin:4,lacticidven:4) ) Recent Results (from the past 240 hour(s))  Resp Panel by RT-PCR (Flu A&B, Covid) Nasopharyngeal Swab     Status: Abnormal   Collection Time: 07/25/20  7:48 PM   Specimen: Nasopharyngeal Swab; Nasopharyngeal(NP) swabs in vial transport medium  Result Value Ref Range Status   SARS Coronavirus 2 by RT PCR POSITIVE (A) NEGATIVE Final    Comment: CRITICAL RESULT CALLED TO, READ BACK BY AND VERIFIED WITH: HARRY,L 2107 07/25/2020 COLEMAN,R (NOTE) SARS-CoV-2 target nucleic acids are DETECTED.  The SARS-CoV-2 RNA is generally detectable in upper respiratory specimens during the acute phase of infection. Positive results are indicative of the presence of the identified  virus, but do not rule out bacterial infection or co-infection with other pathogens not detected by the test. Clinical correlation with patient history and other diagnostic information is necessary to determine patient infection status. The expected result is Negative.  Fact Sheet for Patients: 07/27/2020  Fact Sheet for Healthcare Providers: BloggerCourse.com  This test is not yet approved or cleared by the [employer] FDA and  has been authorized for detection and/or diagnosis of SARS-CoV-2 by FDA under an Emergency Use Authorization (EUA).  This EUA will remain in effect (meaning this te st can be used) for the duration of  the COVID-19 declaration under Section 564(b)(1) of the Act, 21 U.S.C. section 360bbb-3(b)(1), unless the authorization is terminated or revoked sooner.     Influenza A by PCR NEGATIVE NEGATIVE Final   Influenza B by PCR NEGATIVE NEGATIVE Final    Comment: (NOTE) The Xpert Xpress SARS-CoV-2/FLU/RSV plus assay is intended as an aid in the diagnosis of influenza from Nasopharyngeal swab specimens and should not be used as a sole basis for treatment. Nasal washings and aspirates are unacceptable for Xpert Xpress SARS-CoV-2/FLU/RSV testing.  Fact Sheet for Patients: [place]  Fact Sheet for Healthcare Providers: BloggerCourse.com  This test  is not yet approved or cleared by the [place] and has been authorized for detection and/or diagnosis of SARS-CoV-2 by FDA under an Emergency Use Authorization (EUA). This EUA will remain in effect (meaning this test can be used) for the duration of the COVID-19 declaration under Section 564(b)(1) of the Act, 21 U.S.C. section 360bbb-3(b)(1), unless the authorization is terminated or revoked.  Performed at [facility], [address] [place], [place] [address]      Radiological Exams on  Admission: CT HEAD CODE STROKE WO CONTRAST  Result Date: 07/25/2020 CLINICAL DATA:  Code stroke.  Right hand numbness and weakness EXAM: CT HEAD WITHOUT CONTRAST TECHNIQUE: Contiguous axial images were obtained from the base of the skull through the vertex without intravenous contrast. COMPARISON:  None. FINDINGS: Brain: There is no mass, hemorrhage or extra-axial collection. The size and configuration of the ventricles and extra-axial CSF spaces are normal. The brain parenchyma is normal, without evidence of acute or chronic infarction. Vascular: No abnormal hyperdensity of the major intracranial arteries or dural venous sinuses. No intracranial atherosclerosis. Skull: The visualized skull base, calvarium and extracranial soft tissues are normal. Sinuses/Orbits: No fluid levels or advanced mucosal thickening of the visualized paranasal sinuses. No mastoid or middle ear effusion. The orbits are normal. ASPECTS [facility] Stroke Program Early CT Score) - Ganglionic level infarction (caudate, lentiform nuclei, internal capsule, insula, M1-M3 cortex): 7 - Supraganglionic infarction (M4-M6 cortex): 3 Total score (0-10 with 10 being normal): 10 IMPRESSION: 1. Normal head CT. 2. ASPECTS is 10. These results were called by telephone at the time of interpretation on 07/25/2020 at 8:10 pm to provider [facility], who verbally acknowledged these results. Electronically Signed   By: Deatra Robinson M.D.   On: 07/25/2020 20:12    EKG: Independently reviewed.  Sinus rhythm.  No acute ST changes  Assessment/Plan: Active Problems:   Essential hypertension, benign   Hyperlipemia   Right hand weakness    This patient was discussed with the ED physician, including pertinent vitals, physical exam findings, labs, and imaging.  We also discussed care given by the ED provider.  1. Right hand weakness a. Symptoms concerning for stroke b. Transfer to [facility] for continued stroke work-up. MRI/MRA head MRI neck   Echocardiogram tomorrow Hemoglobin A1c, lipid panel in the morning PT/OT/speech therapy consult Full aspirin Permissive hypertension 2. Hypertension a. Hold antihypertensive 3. Hyperlipidemia a. Continue statin  DVT prophylaxis: Lovenox Consultants: None Code Status: Full code Family Communication: None Disposition Plan: Pending   Levie Heritage, DO

## 2020-07-26 ENCOUNTER — Inpatient Hospital Stay (HOSPITAL_COMMUNITY): Payer: Medicare Other

## 2020-07-26 ENCOUNTER — Encounter (HOSPITAL_COMMUNITY): Payer: Self-pay | Admitting: Radiology

## 2020-07-26 DIAGNOSIS — I63412 Cerebral infarction due to embolism of left middle cerebral artery: Secondary | ICD-10-CM | POA: Diagnosis not present

## 2020-07-26 DIAGNOSIS — I10 Essential (primary) hypertension: Secondary | ICD-10-CM

## 2020-07-26 DIAGNOSIS — I6389 Other cerebral infarction: Secondary | ICD-10-CM

## 2020-07-26 DIAGNOSIS — I6340 Cerebral infarction due to embolism of unspecified cerebral artery: Secondary | ICD-10-CM | POA: Insufficient documentation

## 2020-07-26 DIAGNOSIS — R200 Anesthesia of skin: Secondary | ICD-10-CM

## 2020-07-26 LAB — BASIC METABOLIC PANEL
Anion gap: 9 (ref 5–15)
BUN: 7 mg/dL — ABNORMAL LOW (ref 8–23)
CO2: 24 mmol/L (ref 22–32)
Calcium: 9.1 mg/dL (ref 8.9–10.3)
Chloride: 103 mmol/L (ref 98–111)
Creatinine, Ser: 0.97 mg/dL (ref 0.61–1.24)
GFR, Estimated: >60 mL/min (ref >60)
Glucose, Bld: 113 mg/dL — ABNORMAL HIGH (ref 70–99)
Potassium: 3.9 mmol/L (ref 3.5–5.1)
Sodium: 136 mmol/L (ref 135–145)

## 2020-07-26 LAB — C-REACTIVE PROTEIN: CRP: 0.5 mg/dL (ref <1.0)

## 2020-07-26 LAB — LIPID PANEL
Cholesterol: 154 mg/dL (ref 0–200)
HDL: 29 mg/dL — ABNORMAL LOW (ref >40)
LDL Cholesterol: 104 mg/dL — ABNORMAL HIGH (ref 0–99)
Total CHOL/HDL Ratio: 5.3 RATIO
Triglycerides: 107 mg/dL (ref <150)
VLDL: 21 mg/dL (ref 0–40)

## 2020-07-26 LAB — ECHOCARDIOGRAM LIMITED
Area-P 1/2: 3.48 cm2
Height: 70 in
S' Lateral: 2.4 cm
Weight: 2874.8 oz

## 2020-07-26 LAB — HIV ANTIBODY (ROUTINE TESTING W REFLEX): HIV Screen 4th Generation wRfx: NONREACTIVE

## 2020-07-26 LAB — BRAIN NATRIURETIC PEPTIDE: B Natriuretic Peptide: 23.8 pg/mL (ref 0.0–100.0)

## 2020-07-26 LAB — MAGNESIUM: Magnesium: 2.1 mg/dL (ref 1.7–2.4)

## 2020-07-26 LAB — HEMOGLOBIN A1C
Hgb A1c MFr Bld: 5.6 % (ref 4.8–5.6)
Mean Plasma Glucose: 114.02 mg/dL

## 2020-07-26 MED ORDER — POTASSIUM CHLORIDE CRYS ER 20 MEQ PO TBCR
40.0000 meq | EXTENDED_RELEASE_TABLET | Freq: Once | ORAL | Status: AC
  Administered 2020-07-26: 40 meq via ORAL
  Filled 2020-07-26: qty 2

## 2020-07-26 MED ORDER — PRAVASTATIN SODIUM 10 MG PO TABS
20.0000 mg | ORAL_TABLET | Freq: Every day | ORAL | Status: DC
  Administered 2020-07-26: 20 mg via ORAL
  Filled 2020-07-26: qty 2

## 2020-07-26 MED ORDER — ACETAMINOPHEN 325 MG PO TABS: 650.0000 mg | ORAL_TABLET | ORAL | Status: DC | PRN

## 2020-07-26 MED ORDER — ACETAMINOPHEN 160 MG/5ML PO SOLN: 650.0000 mg | ORAL | Status: DC | PRN

## 2020-07-26 MED ORDER — CLOPIDOGREL BISULFATE 75 MG PO TABS
300.0000 mg | ORAL_TABLET | Freq: Once | ORAL | Status: AC
  Administered 2020-07-26: 300 mg via ORAL
  Filled 2020-07-26: qty 4

## 2020-07-26 MED ORDER — CLOPIDOGREL BISULFATE 75 MG PO TABS
75.0000 mg | ORAL_TABLET | Freq: Every day | ORAL | Status: DC
  Administered 2020-07-27: 75 mg via ORAL
  Filled 2020-07-26: qty 1

## 2020-07-26 MED ORDER — ROSUVASTATIN CALCIUM 20 MG PO TABS
20.0000 mg | ORAL_TABLET | Freq: Every day | ORAL | Status: DC
  Administered 2020-07-26: 20 mg via ORAL
  Filled 2020-07-26: qty 1

## 2020-07-26 MED ORDER — ASPIRIN 325 MG PO TABS
325.0000 mg | ORAL_TABLET | Freq: Every day | ORAL | Status: DC
  Administered 2020-07-26: 325 mg via ORAL
  Filled 2020-07-26: qty 1

## 2020-07-26 MED ORDER — SENNOSIDES-DOCUSATE SODIUM 8.6-50 MG PO TABS: 1.0000 | ORAL_TABLET | Freq: Every evening | ORAL | Status: DC | PRN

## 2020-07-26 MED ORDER — ACETAMINOPHEN 650 MG RE SUPP: 650.0000 mg | RECTAL | Status: DC | PRN

## 2020-07-26 MED ORDER — STROKE: EARLY STAGES OF RECOVERY BOOK
Freq: Once | Status: AC
  Filled 2020-07-26: qty 1

## 2020-07-26 MED ORDER — GADOBUTROL 1 MMOL/ML IV SOLN
7.5000 mL | Freq: Once | INTRAVENOUS | Status: AC | PRN
  Administered 2020-07-26: 7.5 mL via INTRAVENOUS

## 2020-07-26 MED ORDER — ENOXAPARIN SODIUM 40 MG/0.4ML ~~LOC~~ SOLN
40.0000 mg | Freq: Every day | SUBCUTANEOUS | Status: DC
  Administered 2020-07-26 – 2020-07-27 (×2): 40 mg via SUBCUTANEOUS
  Filled 2020-07-26 (×2): qty 0.4

## 2020-07-26 NOTE — Plan of Care (Signed)
  Problem: Education: Goal: Knowledge of General Education information will improve Description: Including pain rating scale, medication(s)/side effects and non-pharmacologic comfort measures Outcome: Progressing   Problem: Health Behavior/Discharge Planning: Goal: Ability to manage health-related needs will improve Outcome: Progressing   Problem: Clinical Measurements: Goal: Will remain free from infection Outcome: Progressing Goal: Diagnostic test results will improve Outcome: Progressing Goal: Cardiovascular complication will be avoided Outcome: Progressing   Problem: Activity: Goal: Risk for activity intolerance will decrease Outcome: Progressing   Problem: Nutrition: Goal: Adequate nutrition will be maintained Outcome: Progressing   Problem: Coping: Goal: Level of anxiety will decrease Outcome: Progressing   Problem: Safety: Goal: Ability to remain free from injury will improve Outcome: Progressing   Problem: Skin Integrity: Goal: Risk for impaired skin integrity will decrease Outcome: Progressing   Problem: Education: Goal: Knowledge of disease or condition will improve Outcome: Progressing Goal: Knowledge of secondary prevention will improve Outcome: Progressing Goal: Knowledge of patient specific risk factors addressed and post discharge goals established will improve Outcome: Progressing Goal: Individualized Educational Video(s) Outcome: Progressing   Problem: Coping: Goal: Will verbalize positive feelings about self Outcome: Progressing Goal: Will identify appropriate support needs Outcome: Progressing   Problem: Health Behavior/Discharge Planning: Goal: Ability to manage health-related needs will improve Outcome: Progressing   Problem: Self-Care: Goal: Ability to participate in self-care as condition permits will improve Outcome: Progressing Goal: Verbalization of feelings and concerns over difficulty with self-care will improve Outcome:  Progressing Goal: Ability to communicate needs accurately will improve Outcome: Progressing  Esperanza Sheets, RN 07/26/20 6:08 AM

## 2020-07-26 NOTE — Progress Notes (Signed)
1936 call time 1957 beeper time 1958 exam started 2000 exam finished 2001 images sent to soc 2002 exam completed in epic 2002 [place] rad called

## 2020-07-26 NOTE — Progress Notes (Signed)
PROGRESS NOTE                                                                                                                                                                                                             Patient Demographics:    Fernando Cooper, is a 65 y.o. male, DOB - 05-Jan-1956, GBT:[id number]  Outpatient Primary MD for the patient is Dettinger, Elige Radon, MD    LOS - 1  Admit date - 07/25/2020    Chief Complaint  Patient presents with  . Numbness       Brief Narrative (HPI from H&P) Fernando Cooper is a 65 y.o. male with a history of hypertension, hyperlipidemia.  Patient presents with right hand weakness that started around 12.  Symptoms started as tingling in right hand which progressed to weakness.  As his symptoms are worsening, the patient presented to the hospital for evaluation.  Symptom onset greater than 3 hours from time of presentation.  No palliating or provoking factors.    Subjective:    Fernando Cooper today has, No headache, No chest pain, No abdominal pain - No Nausea, No new weakness tingling or numbness, minimal weakness in the right upper extremity, no SOB   Assessment  & Plan :     1. COVID-19 infection diagnosed 1 week ago and unvaccinated gentleman.  Likely incidental monitor, chest x-ray clear no chest symptoms so far.   Encouraged the patient to sit up in chair in the daytime use I-S and flutter valve for pulmonary toiletry.  Will advance activity and titrate down oxygen as possible.   SpO2: 97 %  Recent Labs  Lab 07/25/20 1948 07/26/20 0606  WBC 7.9  --   HGB 14.7  --   HCT 41.6  --   PLT 313  --   BNP  --  23.8  AST 23  --   ALT 38  --   ALKPHOS 90  --   BILITOT 1.1  --   ALBUMIN 4.0  --   INR 1.0  --   SARSCOV2NAA POSITIVE*  --     2.  Mild right arm weakness.  CT head nonacute, on aspirin and statin, statin switch to Crestor as LDL is above goal, A1c is stable, full  stroke pathway, allow for permissive hypertension, neurology will be consulted.  3.  Dyslipidemia.  Now on Crestor for better control.  4.  Hypertension.  Allow for permissive hypertension due to CVA/TIA possibility.  5.  Hypokalemia.  Replace and monitor.       Condition - Extremely Guarded  Family Communication  : Wife Elnita Maxwell [phone] on 07/26/2020   at 11:17 AM full mailbox  Code Status :  Full  Consults  : Neuro  PUD Prophylaxis :  None   Procedures  :     CT head - Non acute  TTE  MRI/A      Disposition Plan  :    Status is: Inpatient  Remains inpatient appropriate because:IV treatments appropriate due to intensity of illness or inability to take PO   Dispo: The patient is from: Home              Anticipated d/c is to: Home              Patient currently is not medically stable to d/c.   Difficult to place patient No   DVT Prophylaxis  :    enoxaparin (LOVENOX) injection 40 mg Start: 07/26/20 1000  Lab Results  Component Value Date   PLT 313 07/25/2020    Diet :  Diet Order            Diet Heart Room service appropriate? Yes; Fluid consistency: Thin  Diet effective now                  Inpatient Medications  Scheduled Meds: . aspirin  325 mg Oral Daily  . enoxaparin (LOVENOX) injection  40 mg Subcutaneous Daily  . rosuvastatin  20 mg Oral Daily   Continuous Infusions: PRN Meds:.acetaminophen **OR** acetaminophen (TYLENOL) oral liquid 160 mg/5 mL **OR** acetaminophen, senna-docusate  Antibiotics  :    Anti-infectives (From admission, onward)   None       Time Spent in minutes  30   Susa Raring M.D on 07/26/2020 at 11:24 AM  To page go to [email or web address]   [facility] -  Office  [phone]     See all Orders from today for further details    Objective:   Vitals:   07/26/20 0255 07/26/20 0500 07/26/20 0659 07/26/20 0800  BP: (!) 155/95 134/80 129/85 135/84  Pulse: 71 66 64   Resp: 18 18 16 16   Temp:  98.2 F (36.8 C) 98.1 F (36.7 C) 97.8 F (36.6 C) 98.8 F (37.1 C)  TempSrc: Oral Oral Oral Oral  SpO2: 98% 98% 100% 97%  Weight:      Height:        Wt Readings from Last 3 Encounters:  07/26/20 81.5 kg  09/13/19 83.9 kg  07/10/17 84.4 kg    No intake or output data in the 24 hours ending 07/26/20 1124   Physical Exam  Awake Alert, No new F.N deficits, Normal affect [place].AT,PERRAL Supple Neck,No JVD, No cervical lymphadenopathy appriciated.  Symmetrical Chest wall movement, Good air movement bilaterally, CTAB RRR,No Gallops,Rubs or new Murmurs, No Parasternal Heave +ve B.Sounds, Abd Soft, No tenderness, No organomegaly appriciated, No rebound - guarding or rigidity. No Cyanosis, Clubbing or edema, No new Rash or bruise       Data Review:    CBC Recent Labs  Lab 07/25/20 1948  WBC 7.9  HGB 14.7  HCT 41.6  PLT 313  MCV 80.3  MCH 28.4  MCHC 35.3  RDW 12.6  LYMPHSABS 1.7  MONOABS 0.5  EOSABS 0.1  BASOSABS 0.0  Recent Labs  Lab 07/25/20 1948 07/26/20 0606  NA 136  --   K 3.2*  --   CL 102  --   CO2 25  --   GLUCOSE 102*  --   BUN 9  --   CREATININE 0.99  --   CALCIUM 8.8*  --   AST 23  --   ALT 38  --   ALKPHOS 90  --   BILITOT 1.1  --   ALBUMIN 4.0  --   MG  --  2.1  INR 1.0  --   HGBA1C  --  5.6  BNP  --  23.8    ------------------------------------------------------------------------------------------------------------------ Recent Labs    07/26/20 0606  CHOL 154  HDL 29*  LDLCALC 104*  TRIG 107  CHOLHDL 5.3    Lab Results  Component Value Date   HGBA1C 5.6 07/26/2020   ------------------------------------------------------------------------------------------------------------------ No results for input(s): TSH, T4TOTAL, T3FREE, THYROIDAB in the last 72 hours.  Invalid input(s): FREET3  Cardiac Enzymes No results for input(s): CKMB, TROPONINI, MYOGLOBIN in the last 168 hours.  Invalid input(s):  CK ------------------------------------------------------------------------------------------------------------------    Component Value Date/Time   BNP 23.8 07/26/2020 0606    Micro Results Recent Results (from the past 240 hour(s))  Resp Panel by RT-PCR (Flu A&B, Covid) Nasopharyngeal Swab     Status: Abnormal   Collection Time: 07/25/20  7:48 PM   Specimen: Nasopharyngeal Swab; Nasopharyngeal(NP) swabs in vial transport medium  Result Value Ref Range Status   SARS Coronavirus 2 by RT PCR POSITIVE (A) NEGATIVE Final    Comment: CRITICAL RESULT CALLED TO, READ BACK BY AND VERIFIED WITH: HARRY,L 2107 07/25/2020 COLEMAN,R (NOTE) SARS-CoV-2 target nucleic acids are DETECTED.  The SARS-CoV-2 RNA is generally detectable in upper respiratory specimens during the acute phase of infection. Positive results are indicative of the presence of the identified virus, but do not rule out bacterial infection or co-infection with other pathogens not detected by the test. Clinical correlation with patient history and other diagnostic information is necessary to determine patient infection status. The expected result is Negative.  Fact Sheet for Patients: BloggerCourse.comhttps://www.fda.gov/media/152166/download  Fact Sheet for Healthcare Providers: SeriousBroker.ithttps://www.fda.gov/media/152162/download  This test is not yet approved or cleared by the [place] FDA and  has been authorized for detection and/or diagnosis of SARS-CoV-2 by FDA under an Emergency Use Authorization (EUA).  This EUA will remain in effect (meaning this te st can be used) for the duration of  the COVID-19 declaration under Section 564(b)(1) of the Act, 21 U.S.C. section 360bbb-3(b)(1), unless the authorization is terminated or revoked sooner.     Influenza A by PCR NEGATIVE NEGATIVE Final   Influenza B by PCR NEGATIVE NEGATIVE Final    Comment: (NOTE) The Xpert Xpress SARS-CoV-2/FLU/RSV plus assay is intended as an aid in the diagnosis  of influenza from Nasopharyngeal swab specimens and should not be used as a sole basis for treatment. Nasal washings and aspirates are unacceptable for Xpert Xpress SARS-CoV-2/FLU/RSV testing.  Fact Sheet for Patients: BloggerCourse.comhttps://www.fda.gov/media/152166/download  Fact Sheet for Healthcare Providers: SeriousBroker.ithttps://www.fda.gov/media/152162/download  This test is not yet approved or cleared by the [place] FDA and has been authorized for detection and/or diagnosis of SARS-CoV-2 by FDA under an Emergency Use Authorization (EUA). This EUA will remain in effect (meaning this test can be used) for the duration of the COVID-19 declaration under Section 564(b)(1) of the Act, 21 U.S.C. section 360bbb-3(b)(1), unless the authorization is terminated or revoked.  Performed at [facility],  [address] [place], [place] [address]     Radiology Reports DG Chest [place] 1 View  Result Date: 07/26/2020 CLINICAL DATA:  Shortness of breath EXAM: PORTABLE CHEST 1 VIEW COMPARISON:  None. FINDINGS: Artifact from EKG leads. There is no edema, consolidation, effusion, or pneumothorax. Normal heart size and mediastinal contours. IMPRESSION: Negative portable chest. Electronically Signed   By: Marnee Spring M.D.   On: 07/26/2020 08:33   CT HEAD CODE STROKE WO CONTRAST  Result Date: 07/25/2020 CLINICAL DATA:  Code stroke.  Right hand numbness and weakness EXAM: CT HEAD WITHOUT CONTRAST TECHNIQUE: Contiguous axial images were obtained from the base of the skull through the vertex without intravenous contrast. COMPARISON:  None. FINDINGS: Brain: There is no mass, hemorrhage or extra-axial collection. The size and configuration of the ventricles and extra-axial CSF spaces are normal. The brain parenchyma is normal, without evidence of acute or chronic infarction. Vascular: No abnormal hyperdensity of the major intracranial arteries or dural venous sinuses. No intracranial atherosclerosis. Skull: The visualized skull  base, calvarium and extracranial soft tissues are normal. Sinuses/Orbits: No fluid levels or advanced mucosal thickening of the visualized paranasal sinuses. No mastoid or middle ear effusion. The orbits are normal. ASPECTS [facility] Stroke Program Early CT Score) - Ganglionic level infarction (caudate, lentiform nuclei, internal capsule, insula, M1-M3 cortex): 7 - Supraganglionic infarction (M4-M6 cortex): 3 Total score (0-10 with 10 being normal): 10 IMPRESSION: 1. Normal head CT. 2. ASPECTS is 10. These results were called by telephone at the time of interpretation on 07/25/2020 at 8:10 pm to provider [facility], who verbally acknowledged these results. Electronically Signed   By: Deatra Robinson M.D.   On: 07/25/2020 20:12

## 2020-07-26 NOTE — Evaluation (Signed)
Physical Therapy Evaluation & Discharge Patient Details Name: Fernando Cooper MRN: [id number] DOB: [birth year] Today's Date: 07/26/2020   History of Present Illness  Pt is a 65 y.o. male admitted 07/25/20 with R hand tingling and progressive weakness; incidental (+) COVID-19. Head CT negative for acute abnormality. Brain MRI shows small acute infarcts in L MCA. PMH includes HTN, HLD.    Clinical Impression  Patient evaluated by Physical Therapy with no further acute PT needs identified. PTA, pt independent, active, works on his farm, lives with wife. Today, pt independent with tranfers and ambulation. Pt with difficulty performing some ADL tasks secondary to residual R hand/wrist weakness. Pt will benefit from Occupational Therapy Evaluation and intervention. All education has been completed and the patient has no further questions. Acute PT is signing off. Thank you for this referral.    Follow Up Recommendations No PT follow up    Equipment Recommendations  None recommended by PT    Recommendations for Other Services OT consult     Precautions / Restrictions Precautions Precautions: None Restrictions Weight Bearing Restrictions: No      Mobility  Bed Mobility Overal bed mobility: Independent                  Transfers Overall transfer level: Independent                  Ambulation/Gait Ambulation/Gait assistance: Independent Gait Distance (Feet): 100 Feet Assistive device: None Gait Pattern/deviations: WFL(Within Functional Limits)     General Gait Details: Ambulated multiple laps in room, independent without overt instability or LOB  Stairs            Wheelchair Mobility    Modified Rankin (Stroke Patients Only) Modified Rankin (Stroke Patients Only) Pre-Morbid Rankin Score: No symptoms Modified Rankin: Slight disability     Balance Overall balance assessment: No apparent balance deficits (not formally assessed)                                            Pertinent Vitals/Pain Pain Assessment: No/denies pain    Home Living Family/patient expects to be discharged to:: Private residence Living Arrangements: Spouse/significant other Available Help at Discharge: Family;Available 24 hours/day Type of Home: Mobile home Home Access: Stairs to enter Entrance Stairs-Rails: Can reach both Entrance Stairs-Number of Steps: 3 Home Layout: One level Home Equipment: Grab bars - tub/shower;Hand held shower head;Other (comment);Shower seat (access to all DME if needed)      Prior Function Level of Independence: Independent         Comments: Independent without DME. 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 Drive     Hand Dominance   Dominant Hand: Right    Extremity/Trunk Assessment   Upper Extremity Assessment Upper Extremity Assessment: RUE deficits/detail RUE Deficits / Details: incoordination, weakness, t/n has improved RUE Sensation: WNL RUE Coordination: decreased fine motor;decreased gross motor    Lower Extremity Assessment Lower Extremity Assessment: Overall WFL for tasks assessed    Cervical / Trunk Assessment Cervical / Trunk Assessment: Normal  Communication   Communication: No difficulties  Cognition Arousal/Alertness: Awake/alert Behavior During Therapy: WFL for tasks assessed/performed Overall Cognitive Status: Within Functional Limits for tasks assessed  General Comments: Ox4      General Comments General comments (skin integrity, edema, etc.): no edema, skin intact, no falls    Exercises     Assessment/Plan    PT Assessment Patent does not need any further PT services  PT Problem List         PT Treatment Interventions      PT Goals (Current goals can be found in the Care Plan section)  Acute Rehab PT Goals Patient Stated Goal: Potential outpatient OT for R hand deficits - "I'll do  whatever I need to do to use this hand again" PT Goal Formulation: All assessment and education complete, DC therapy    Frequency     Barriers to discharge        Co-evaluation               AM-PAC PT "6 Clicks" Mobility  Outcome Measure Help needed turning from your back to your side while in a flat bed without using bedrails?: None Help needed moving from lying on your back to sitting on the side of a flat bed without using bedrails?: None Help needed moving to and from a bed to a chair (including a wheelchair)?: None Help needed standing up from a chair using your arms (e.g., wheelchair or bedside chair)?: None Help needed to walk in hospital room?: None Help needed climbing 3-5 steps with a railing? : None 6 Click Score: 24    End of Session   Activity Tolerance: Patient tolerated treatment well Patient left: in chair;with call bell/phone within reach Nurse Communication: Mobility status PT Visit Diagnosis: Other symptoms and signs involving the nervous system (R29.898)    Time: 0998-3382 PT Time Calculation (min) (ACUTE ONLY): 13 min   Charges:   PT Evaluation $PT Eval Low Complexity: 1 Low     Ina Homes, PT, DPT Acute Rehabilitation Services  Pager [phone] Office [phone]  Malachy Chamber 07/26/2020, 4:38 PM

## 2020-07-26 NOTE — Progress Notes (Signed)
  Echocardiogram 2D Echocardiogram has been performed.  Fernando Cooper 07/26/2020, 5:08 PM

## 2020-07-26 NOTE — Progress Notes (Signed)
Occupational Therapy Evaluation Patient Details Name: Fernando Cooper MRN: [id number] DOB: [birth year] Today's Date: 07/26/2020    History of Present Illness Pt is a 65 y.o. male admitted 07/25/20 with R hand tingling and progressive weakness; incidental (+) COVID-19. Head CT negative for acute abnormality. Brain MRI shows small acute infarcts in L MCA. PMH includes HTN, HLD.   Clinical Impression   Prior to hospitalization, pt was living with his wife in a 1-level mobile home with 3 STE (bilateral hand railings). Pt performed ADLs/ADL mobility/IADLs with independence and no mobility device. Pt reports that both him and his wife drive. Today, pt received semi-reclined in bed, pt agreeable to OT eval. Pt presents with RUE weakness and incoordination. Pt reports that his RUE sensation has improved, no c/o tingling/numbness anymore. Pt is currently performing all ADLs/ADL mobility with mod I (extended time)-supervision without a mobility device. OT guided pt through BUE HEP to increase strength/ROM in prep for ADL/IADL function. See details regarding HEP listed below. OT emphasized positioning, support, form, pacing, holding, and gentleness to RUE. Educated pt on safety awareness, optimal positioning for RUE, encouragement to use RUE in functional tasks, d/c recs (OPOT), adaptive ADL strategies, activity pacing. Pt would benefit from 1-2 more acute OT sessions to practice BUE HEP to build strength/coordination in RUE in prep for ADL/IADL function. See recs below.     Follow Up Recommendations  Outpatient OT;Supervision - Intermittent;Other (comment) (OPOT with neuro rehab focus to manage RUE weakness/incoordination)    Equipment Recommendations  None recommended by OT    Recommendations for Other Services Other (comment) (None)     Precautions / Restrictions Precautions Precautions: None Restrictions Weight Bearing Restrictions: No      Mobility Bed Mobility Overal bed mobility: Independent     General bed mobility comments: independent without using railing    Transfers Overall transfer level: Independent Equipment used: None      Balance Overall balance assessment: No apparent balance deficits (not formally assessed)      ADL either performed or assessed with clinical judgement   ADL Overall ADL's : Modified independent   General ADL Comments: mod I for extended time 2/2 RUE incoordination and weakness, no LOB, no mobility device needed     Vision Baseline Vision/History: Wears glasses Wears Glasses: At all times Patient Visual Report: No change from baseline Vision Assessment?: No apparent visual deficits     Perception Perception Perception Tested?: No   Praxis Praxis Praxis tested?: Not tested    Pertinent Vitals/Pain Pain Assessment: No/denies pain     Hand Dominance Right   Extremity/Trunk Assessment Upper Extremity Assessment Upper Extremity Assessment: RUE deficits/detail RUE Deficits / Details: incoordination, weakness, t/n has improved RUE Sensation: WNL RUE Coordination: decreased fine motor;decreased gross motor   Lower Extremity Assessment Lower Extremity Assessment: Overall WFL for tasks assessed   Cervical / Trunk Assessment Cervical / Trunk Assessment: Normal   Communication Communication Communication: No difficulties   Cognition Arousal/Alertness: Awake/alert Behavior During Therapy: WFL for tasks assessed/performed Overall Cognitive Status: Within Functional Limits for tasks assessed     General Comments: Ox4   General Comments  no edema, skin intact, no falls    Exercises Exercises: General Upper Extremity;Shoulder;Hand exercises General Exercises - Upper Extremity Shoulder Flexion: AROM;AAROM;Self ROM;Strengthening;Both;10 reps;Seated Shoulder ABduction: AROM;AAROM;Self ROM;Strengthening;Both;10 reps;Seated Elbow Flexion: AROM;AAROM;Self ROM;Strengthening;Both;10 reps;Seated Elbow Extension: AROM;AAROM;Self  ROM;Strengthening;Both;10 reps;Seated Wrist Flexion: AROM;AAROM;Self ROM;Strengthening;Both;10 reps;Seated Wrist Extension: AROM;AAROM;Self ROM;Strengthening;Both;10 reps;Seated Digit Composite Flexion: AROM;AAROM;Self ROM;Strengthening;Both;10 reps;Seated Composite Extension: AROM;AAROM;Self ROM;Strengthening;Both;10 reps;Seated  Home Living Family/patient expects to be discharged to:: Private residence Living Arrangements: Spouse/significant other Available Help at Discharge: Family;Available 24 hours/day Type of Home: Mobile home Home Access: Stairs to enter Entrance Stairs-Number of Steps: 3 Entrance Stairs-Rails: Can reach both Home Layout: One level     Bathroom Shower/Tub: Producer, television/film/video: Handicapped height Bathroom Accessibility: Yes How Accessible: Other (comment);Accessible via walker Home Equipment: Grab bars - tub/shower;Hand held shower head;Other (comment);Shower seat (access to all DME if needed)          Prior Functioning/Environment Level of Independence: Independent        Comments: Independent without DME. 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 Drive        OT Problem List: Decreased strength;Decreased range of motion;Decreased coordination;Impaired UE functional use      OT Treatment/Interventions: Therapeutic exercise;Neuromuscular education;Therapeutic activities;Patient/family education    OT Goals(Current goals can be found in the care plan section) Acute Rehab OT Goals Patient Stated Goal: Potential outpatient OT for R hand deficits - "I'll do whatever I need to do to use this hand again" OT Goal Formulation: With patient Time For Goal Achievement: 08/09/20 Potential to Achieve Goals: Good  OT Frequency: Other (comment) (1-2 more acute OT sessions to clear, recommend home with PRN S/A and OPOT with neuro focus to manage RUE weakness and incoordination)    AM-PAC OT "6 Clicks"  Daily Activity     Outcome Measure Help from another person eating meals?: None Help from another person taking care of personal grooming?: None Help from another person toileting, which includes using toliet, bedpan, or urinal?: None Help from another person bathing (including washing, rinsing, drying)?: None Help from another person to put on and taking off regular upper body clothing?: None Help from another person to put on and taking off regular lower body clothing?: None 6 Click Score: 24   End of Session Equipment Utilized During Treatment: Gait belt Nurse Communication: Mobility status  Activity Tolerance: Patient tolerated treatment well Patient left: in bed;with call bell/phone within reach  OT Visit Diagnosis: Muscle weakness (generalized) (M62.81);Hemiplegia and hemiparesis Hemiplegia - Right/Left: Right Hemiplegia - dominant/non-dominant: Dominant Hemiplegia - caused by:  (CVA)                Time: 2585-2778 OT Time Calculation (min): 22 min Charges:  OT General Charges $OT Visit: 1 Visit OT Evaluation $OT Eval Low Complexity: 1 Low OT Treatments $Therapeutic Exercise: 8-22 mins  Norris Cross, OTR/L [facility] Acute Rehab Services [phone]   Mechele Claude 07/26/2020, 4:45 PM

## 2020-07-26 NOTE — Consult Note (Signed)
Neurology Consultation  Reason for Consult: MRI with  Referring Physician: Dr. Thedore Mins  CC: Right hand weakness  History is obtained from: Patient, Chart review  HPI: Fernando Cooper is a 65 y.o. male with a medical history significant for hypertension and hyperlipidemia who presented to [facility] 4/23 for evaluation of right hand weakness. He states that after eating lunch yesterday he noticed some tingling of the fingers on his right hand that slowly progressed to involve his full hand and the tingling then progressed to numbness. He became concerned when he noticed decreased strength and motor movements of his right hand prompting him to be evaluated. He initially was seen at [facility] and transferred to [facility] for MRI brain evaluation. Following MRI, neurology was consulted for further stroke work up.   Hospital course is additionally notable for COVID-19 positive result, felt to be incidental given lack of any respiratory or other symptoms   LKW: 4/23 12:30 tpa given?: no, outside of time window IR Thrombectomy? No, presentation not consistent with suspected LVO Modified Rankin Scale: 0-Completely asymptomatic and back to baseline post- stroke  ROS: A complete ROS was performed and is negative except as noted in the HPI.  Past Medical History:  Diagnosis Date  . Hyperlipidemia   . Hypertension    Past Surgical History:  Procedure Laterality Date  . NOSE SURGERY     Family History  Problem Relation Age of Onset  . Heart disease Mother   . Heart disease Father   . Cancer Sister   . Diabetes Brother    Social History:   reports that he has never smoked. He has never used smokeless tobacco. He reports that he does not drink alcohol and does not use drugs.  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  Medications  Current Facility-Administered Medications:  .  acetaminophen (TYLENOL) tablet 650 mg, 650 mg, Oral, Q4H PRN **OR** [DISCONTINUED]  acetaminophen (TYLENOL) 160 MG/5ML solution 650 mg, 650 mg, Per Tube, Q4H PRN **OR** [DISCONTINUED] acetaminophen (TYLENOL) suppository 650 mg, 650 mg, Rectal, Q4H PRN, Levie Heritage, DO .  aspirin tablet 325 mg, 325 mg, Oral, Daily, Leroy Sea, MD, 325 mg at 07/26/20 0851 .  enoxaparin (LOVENOX) injection 40 mg, 40 mg, Subcutaneous, Daily, Levie Heritage, DO, 40 mg at 07/26/20 5465 .  potassium chloride SA (KLOR-CON) CR tablet 40 mEq, 40 mEq, Oral, Once, Leroy Sea, MD .  rosuvastatin (CRESTOR) tablet 20 mg, 20 mg, Oral, Daily, Thedore Mins, Stanford Scotland, MD .  senna-docusate (Senokot-S) tablet 1 tablet, 1 tablet, Oral, QHS PRN, Levie Heritage, DO  Exam: Current vital signs: BP 132/82 (BP Location: Left Arm)   Pulse 69   Temp 97.7 F (36.5 C) (Oral)   Resp 18   Ht 5\' 10"  (1.778 m)   Wt 81.5 kg   SpO2 97%   BMI 25.78 kg/m  Vital signs in last 24 hours: Temp:  [97.6 F (36.4 C)-98.8 F (37.1 C)] 97.7 F (36.5 C) (04/24 1131) Pulse Rate:  [62-79] 69 (04/24 1131) Resp:  [13-20] 18 (04/24 1131) BP: (127-161)/(80-95) 132/82 (04/24 1131) SpO2:  [96 %-100 %] 97 % (04/24 0800) Weight:  [81.5 kg-83.9 kg] 81.5 kg (04/24 0200)  GENERAL: Awake, alert, sitting up in bed watching television, in no acute distress Psych: Affect appropriate for situation, calm and cooperative with examination Head: Normocephalic and atraumatic, without obvious abnormality EENT: Wears eyeglasses, normal conjunctivae, missing multiple teeth, dry mm  LUNGS: Normal respiratory effort. Non-labored breathing CV: regular rate and rhythm on cardiac monitor ABDOMEN: Soft, non-tender, non-distended Ext: warm, well perfused, no obvious abnormality  NEURO:  Mental Status: Awake, alert, and oriented to self, place, age, month, year, and situation. Patient is able to give a clear and coherent history of present illness. Speech is intact with mild dysarthria (baseline per patient). No aphasia or neglect noted.  Naming, comprehension, repetition, and fluency are intact. Cranial Nerves:  II: PERRL 3 --> 2 mm/brisk. Visual fields full.  III, IV, VI: EOMI. Lid elevation symmetric and full.  V: Sensation is intact to light touch and symmetrical to face.  VII: Face is symmetric resting and smiling.  VIII: Hearing is intact to voice IX, X: Palate elevation is symmetric. Phonation normal.  XI: Normal sternocleidomastoid and trapezius muscle strength XII: Tongue protrudes midline without fasciculations.   Motor: 5/5 strength present in bilateral lower extremities, 5/5 strength of left upper extremity: biceps, triceps, shoulders, and grip strength. Right upper extremity: 5/5 right shoulder, triceps, biceps, 2/5 grip strength. Tone is normal. Bulk is normal.  Sensation: Intact to light touch bilaterally in all four extremities except decreased sensation to light touch on the right hand due to numbness. No extinction to DSS present.  Coordination: FTN intact bilaterally. HKS intact bilaterally. No pronator drift.  DTRs: 2+ and symmetric biceps and brachioradialis, 3+ and symmetric bilateral patellae Plantar: Toes downgoing bilaterally Gait- deferred  NIHSS: 1a Level of Conscious.: 0 1b LOC Questions: 0 1c LOC Commands: 0 2 Best Gaze: 0 3 Visual: 0 4 Facial Palsy: 0 5a Motor Arm - left: 0 5b Motor Arm - Right: 0 6a Motor Leg - Left: 0 6b Motor Leg - Right: 0 7 Limb Ataxia: 0 8 Sensory: 0 9 Best Language: 0 10 Dysarthria: 1 11 Extinct. and Inatten.: 0 TOTAL: 1; baseline dysarthria per patient   Labs I have reviewed labs in epic and the results pertinent to this consultation are: CBC    Component Value Date/Time   WBC 7.9 07/25/2020 1948   RBC 5.18 07/25/2020 1948   HGB 14.7 07/25/2020 1948   HGB 15.3 09/13/2019 1510   HCT 41.6 07/25/2020 1948   HCT 45.5 09/13/2019 1510   PLT 313 07/25/2020 1948   PLT 250 09/13/2019 1510   MCV 80.3 07/25/2020 1948   MCV 83 09/13/2019 1510   MCH 28.4  07/25/2020 1948   MCHC 35.3 07/25/2020 1948   RDW 12.6 07/25/2020 1948   RDW 13.4 09/13/2019 1510   LYMPHSABS 1.7 07/25/2020 1948   LYMPHSABS 2.0 09/13/2019 1510   MONOABS 0.5 07/25/2020 1948   EOSABS 0.1 07/25/2020 1948   EOSABS 0.2 09/13/2019 1510   BASOSABS 0.0 07/25/2020 1948   BASOSABS 0.0 09/13/2019 1510    CMP     Component Value Date/Time   NA 136 07/25/2020 1948   NA 138 09/13/2019 1510   K 3.2 (L) 07/25/2020 1948   CL 102 07/25/2020 1948   CO2 25 07/25/2020 1948   GLUCOSE 102 (H) 07/25/2020 1948   BUN 9 07/25/2020 1948   BUN 9 09/13/2019 1510   CREATININE 0.99 07/25/2020 1948   CALCIUM 8.8 (L) 07/25/2020 1948   PROT 6.9 07/25/2020 1948   PROT 6.7 09/13/2019 1510   ALBUMIN 4.0 07/25/2020 1948   ALBUMIN 4.4 09/13/2019 1510   AST 23 07/25/2020 1948   ALT 38 07/25/2020 1948   ALKPHOS 90 07/25/2020 1948   BILITOT 1.1 07/25/2020 1948   BILITOT 1.1 09/13/2019 1510  GFRNONAA >60 07/25/2020 1948   GFRAA 78 09/13/2019 1510    Lipid Panel     Component Value Date/Time   CHOL 154 07/26/2020 0606   CHOL 213 (H) 09/13/2019 1510   TRIG 107 07/26/2020 0606   HDL 29 (L) 07/26/2020 0606   HDL 31 (L) 09/13/2019 1510   CHOLHDL 5.3 07/26/2020 0606   VLDL 21 07/26/2020 0606   LDLCALC 104 (H) 07/26/2020 0606   LDLCALC 108 (H) 09/13/2019 1510   Lab Results  Component Value Date   HGBA1C 5.6 07/26/2020   Imaging I have reviewed the images obtained: CT at outside hospital without acute finding  MRI/MRA examination of the brain/head/neck: 1. Cluster of small acute infarcts in the left MCA upper division watershed territory. There is focal high-grade narrowing at the left MCA bifurcation. 2. Irregularity of bilateral posterior cerebral arteries with moderate right P3 segment stenosis, presumably atheromatous.  Assessment: 65 year old male who presented to the ED for evaluation of right hand weakness s/p MRI evaluation revealing multiple small acute infarcts within the  left MCA distrubution with focal high-grade narrowing of the left MCA bifurcation.  - Examination reveals patient with persistent right hand weakness and numbness without any further neurologic deficits.  - Stroke risk factors include hypertension, hyperlipidemia, CAD. Patient denies previous home ASA or statin therapy.  - MRI/MRA imaging with left MCA distribution strokes and focal high-grade narrowing at the left MCA bifurcation - Acute infarcts most likely secondary to atherosclerosis with evidence of high-grade vessel narrowing of the left MCA bifurcation with subsequent left MCA territory acute infarcts.   Impression: Multiple small acute infarcts in the left MCA region  Recommendations: - Frequent neuro checks - Echocardiogram - LDL elevated, agree with high intensity statin therapy (patient on Crestor 20) - Symptoms stable with systolic blood pressures in the 130's okay for gradual normalization of blood pressure - Prophylactic therapy- Antiplatelet med:   -DAPT therapy for 90 days followed by ASA monotherapy:  - Aspirin - dose 325mg  PO or 300mg  PR with ASA 81 mg daily lifelong  - WITH clopidogrel 300 mg loading dose followed by 75 mg daily for 90 days - Risk factor modification - Telemetry monitoring - PT consult, OT consult, Speech consult - Stroke team to follow  Pt seen by NP/Neuro and later by MD. Note/plan to be edited by MD as needed.  , AGAC-NP [facility] Pager: [phone]  Attending Neurologist's note:  I personally saw this patient, gathering history, performing a full neurologic examination, reviewing relevant labs, personally reviewing relevant imaging including MRI brain, MRA head and neck, and formulated the assessment and plan, adding the note above for completeness and clarity to accurately reflect my thoughts  Lanae Boast MD-PhD [facility] [phone] Available 7 AM to 7 PM, outside these hours please contact  Neurologist on call listed on AMION

## 2020-07-27 DIAGNOSIS — I63412 Cerebral infarction due to embolism of left middle cerebral artery: Secondary | ICD-10-CM | POA: Diagnosis not present

## 2020-07-27 DIAGNOSIS — R29898 Other symptoms and signs involving the musculoskeletal system: Secondary | ICD-10-CM

## 2020-07-27 DIAGNOSIS — R200 Anesthesia of skin: Secondary | ICD-10-CM | POA: Diagnosis not present

## 2020-07-27 LAB — CBC WITH DIFFERENTIAL/PLATELET
Abs Immature Granulocytes: 0.05 10*3/uL (ref 0.00–0.07)
Basophils Absolute: 0 10*3/uL (ref 0.0–0.1)
Basophils Relative: 0 %
Eosinophils Absolute: 0.1 10*3/uL (ref 0.0–0.5)
Eosinophils Relative: 1 %
HCT: 42.9 % (ref 39.0–52.0)
Hemoglobin: 14.7 g/dL (ref 13.0–17.0)
Immature Granulocytes: 1 %
Lymphocytes Relative: 22 %
Lymphs Abs: 1.7 10*3/uL (ref 0.7–4.0)
MCH: 27.5 pg (ref 26.0–34.0)
MCHC: 34.3 g/dL (ref 30.0–36.0)
MCV: 80.2 fL (ref 80.0–100.0)
Monocytes Absolute: 0.5 10*3/uL (ref 0.1–1.0)
Monocytes Relative: 7 %
Neutro Abs: 5.1 10*3/uL (ref 1.7–7.7)
Neutrophils Relative %: 69 %
Platelets: 284 10*3/uL (ref 150–400)
RBC: 5.35 MIL/uL (ref 4.22–5.81)
RDW: 12.5 % (ref 11.5–15.5)
WBC: 7.5 10*3/uL (ref 4.0–10.5)
nRBC: 0 % (ref 0.0–0.2)

## 2020-07-27 LAB — COMPREHENSIVE METABOLIC PANEL
ALT: 23 U/L (ref 0–44)
AST: 15 U/L (ref 15–41)
Albumin: 3.5 g/dL (ref 3.5–5.0)
Alkaline Phosphatase: 84 U/L (ref 38–126)
Anion gap: 7 (ref 5–15)
BUN: 7 mg/dL — ABNORMAL LOW (ref 8–23)
CO2: 26 mmol/L (ref 22–32)
Calcium: 9.1 mg/dL (ref 8.9–10.3)
Chloride: 105 mmol/L (ref 98–111)
Creatinine, Ser: 1.01 mg/dL (ref 0.61–1.24)
GFR, Estimated: >60 mL/min (ref >60)
Glucose, Bld: 92 mg/dL (ref 70–99)
Potassium: 3.8 mmol/L (ref 3.5–5.1)
Sodium: 138 mmol/L (ref 135–145)
Total Bilirubin: 1.6 mg/dL — ABNORMAL HIGH (ref 0.3–1.2)
Total Protein: 5.8 g/dL — ABNORMAL LOW (ref 6.5–8.1)

## 2020-07-27 LAB — C-REACTIVE PROTEIN: CRP: 1.3 mg/dL — ABNORMAL HIGH (ref <1.0)

## 2020-07-27 LAB — BRAIN NATRIURETIC PEPTIDE: B Natriuretic Peptide: 14.3 pg/mL (ref 0.0–100.0)

## 2020-07-27 LAB — MAGNESIUM: Magnesium: 2.1 mg/dL (ref 1.7–2.4)

## 2020-07-27 MED ORDER — PRAVASTATIN SODIUM 40 MG PO TABS: 40.0000 mg | ORAL_TABLET | Freq: Every day | ORAL | 0 refills | Status: AC

## 2020-07-27 MED ORDER — AMLODIPINE BESYLATE 10 MG PO TABS: 10.0000 mg | ORAL_TABLET | Freq: Every day | ORAL | 3 refills | Status: AC

## 2020-07-27 MED ORDER — PANTOPRAZOLE SODIUM 40 MG PO TBEC: 40.0000 mg | DELAYED_RELEASE_TABLET | Freq: Every day | ORAL | 2 refills | Status: AC

## 2020-07-27 MED ORDER — PRAVASTATIN SODIUM 40 MG PO TABS: 40.0000 mg | ORAL_TABLET | Freq: Every day | ORAL | Status: DC

## 2020-07-27 MED ORDER — ASPIRIN 81 MG PO TBEC: 81.0000 mg | DELAYED_RELEASE_TABLET | Freq: Every day | ORAL | 11 refills | Status: AC

## 2020-07-27 MED ORDER — ASPIRIN EC 81 MG PO TBEC
81.0000 mg | DELAYED_RELEASE_TABLET | Freq: Every day | ORAL | Status: DC
  Administered 2020-07-27: 81 mg via ORAL
  Filled 2020-07-27: qty 1

## 2020-07-27 MED ORDER — CLOPIDOGREL BISULFATE 75 MG PO TABS: 75.0000 mg | ORAL_TABLET | Freq: Every day | ORAL | 2 refills | Status: AC

## 2020-07-27 NOTE — Progress Notes (Addendum)
STROKE TEAM PROGRESS NOTE   INTERVAL HISTORY Patient up in his bed alert and oriented. Patient reports he feels much better and notes that his RUE although still weak has already had improvement in strength. Patient denies any problems with speech or LE weakness. Patient reports that he is looking to continue rehab of his RUE in OP and reports that he did not take ASA or Plavix prior to this CVA. Patient Covid positive for >/= 10 days.   Vitals:   07/26/20 1131 07/26/20 2007 07/27/20 0000 07/27/20 0400  BP: 132/82 135/84 128/84 121/67  Pulse: 69 68 62 63  Resp: 18 20 18 18   Temp: 97.7 F (36.5 C) 98 F (36.7 C) 98.2 F (36.8 C) 97.9 F (36.6 C)  TempSrc: Oral  Oral Oral  SpO2:  99% 99% 99%  Weight:      Height:       CBC:  Recent Labs  Lab 07/25/20 1948 07/27/20 0149  WBC 7.9 7.5  NEUTROABS 5.5 5.1  HGB 14.7 14.7  HCT 41.6 42.9  MCV 80.3 80.2  PLT 313 284   Basic Metabolic Panel:  Recent Labs  Lab 07/26/20 0606 07/26/20 1213 07/27/20 0149  NA  --  136 138  K  --  3.9 3.8  CL  --  103 105  CO2  --  24 26  GLUCOSE  --  113* 92  BUN  --  7* 7*  CREATININE  --  0.97 1.01  CALCIUM  --  9.1 9.1  MG 2.1  --  2.1   Lipid Panel:  Recent Labs  Lab 07/26/20 0606  CHOL 154  TRIG 107  HDL 29*  CHOLHDL 5.3  VLDL 21  LDLCALC 07/28/20*   HgbA1c:  Recent Labs  Lab 07/26/20 0606  HGBA1C 5.6   Urine Drug Screen:  Recent Labs  Lab 07/25/20 2230  LABOPIA NONE DETECTED  COCAINSCRNUR NONE DETECTED  LABBENZ NONE DETECTED  AMPHETMU NONE DETECTED  THCU NONE DETECTED  LABBARB NONE DETECTED    Alcohol Level  Recent Labs  Lab 07/25/20 1948  ETH <10    IMAGING past 24 hours ECHOCARDIOGRAM LIMITED  Result Date: 07/26/2020    ECHOCARDIOGRAM LIMITED REPORT   Patient Name:   Fernando Cooper Date of Exam: 07/26/2020 Medical Rec #:  07/28/2020    Height:       70.0 in Accession #:    [id number]   Weight:       179.7 lb Date of Birth:  03/10/1956   BSA:          1.994 m  Patient Age:    64 years     BP:           132/82 mmHg Patient Gender: M            HR:           67 bpm. Exam Location:  Inpatient Procedure: Limited Color Doppler, Cardiac Doppler and Limited Echo Indications:    TIA  History:        Patient has no prior history of Echocardiogram examinations.                 Risk Factors:Hypertension and Dyslipidemia.  Sonographer:    02/23/1956 Referring Phys: [phone] JACOB J STINSON  Sonographer Comments: Suboptimal subcostal window. IMPRESSIONS  1. Left ventricular ejection fraction, by estimation, is 60 to 65%. The left ventricle has normal function. The left ventricle has no regional wall motion abnormalities.  2. Right ventricular systolic function is normal. The right ventricular size is normal. Tricuspid regurgitation signal is inadequate for assessing PA pressure.  3. The mitral valve is normal in structure. Trivial mitral valve regurgitation. No evidence of mitral stenosis.  4. The aortic valve is tricuspid. Aortic valve regurgitation is not visualized. Mild aortic valve sclerosis is present, with no evidence of aortic valve stenosis.  5. The inferior vena cava is normal in size with greater than 50% respiratory variability, suggesting right atrial pressure of 3 mmHg. FINDINGS  Left Ventricle: Left ventricular ejection fraction, by estimation, is 60 to 65%. The left ventricle has normal function. The left ventricle has no regional wall motion abnormalities. The left ventricular internal cavity size was normal in size. There is  no left ventricular hypertrophy. Right Ventricle: The right ventricular size is normal. No increase in right ventricular wall thickness. Right ventricular systolic function is normal. Tricuspid regurgitation signal is inadequate for assessing PA pressure. Left Atrium: Left atrial size was normal in size. Right Atrium: Right atrial size was normal in size. Pericardium: There is no evidence of pericardial effusion. Mitral Valve: The mitral  valve is normal in structure. Trivial mitral valve regurgitation. No evidence of mitral valve stenosis. Tricuspid Valve: The tricuspid valve is normal in structure. Tricuspid valve regurgitation is trivial. No evidence of tricuspid stenosis. Aortic Valve: The aortic valve is tricuspid. Aortic valve regurgitation is not visualized. Mild aortic valve sclerosis is present, with no evidence of aortic valve stenosis. Pulmonic Valve: The pulmonic valve was normal in structure. Pulmonic valve regurgitation is not visualized. No evidence of pulmonic stenosis. Aorta: The aortic root is normal in size and structure. Venous: The inferior vena cava was not well visualized. The inferior vena cava is normal in size with greater than 50% respiratory variability, suggesting right atrial pressure of 3 mmHg. IAS/Shunts: No atrial level shunt detected by color flow Doppler. LEFT VENTRICLE PLAX 2D LVIDd:         4.30 cm LVIDs:         2.40 cm LV PW:         1.00 cm LV IVS:        0.90 cm LVOT diam:     2.00 cm LV SV:         65 LV SV Index:   33 LVOT Area:     3.14 cm  LEFT ATRIUM         Index LA diam:    3.50 cm 1.75 cm/m  AORTIC VALVE LVOT Vmax:   101.00 cm/s LVOT Vmean:  66.400 cm/s LVOT VTI:    0.207 m  AORTA Ao Asc diam: 3.10 cm MITRAL VALVE               TRICUSPID VALVE MV Area (PHT): 3.48 cm    TR Peak grad:   16.3 mmHg MV Decel Time: 218 msec    TR Vmax:        202.00 cm/s MV E velocity: 54.80 cm/s MV A velocity: 73.30 cm/s  SHUNTS MV E/A ratio:  0.75        Systemic VTI:  0.21 m                            Systemic Diam: 2.00 cm Armanda Magic MD Electronically signed by Armanda Magic MD Signature Date/Time: 07/26/2020/10:12:21 PM    Final     PHYSICAL EXAM GENERAL: Pleasant middle-aged Caucasian male awake, alert, sitting  up in bed, in no acute distress Psych: Affect appropriate for situation, calm and cooperative with examination Head: Normocephalic and atraumatic, without obvious abnormality EENT: Wears eyeglasses,  normal conjunctivae, missing multiple teeth, dry mm LUNGS: Normal respiratory effort. Non-labored breathing CV: regular rate and rhythm on cardiac monitor Psych: Grateful/euthymic mood and congruent affect Ext: warm, well perfused, no obvious abnormality  NEURO:  Mental Status: Awake, alert, and oriented to self, place, month, year, and situation. Patient is able to give a clear and coherent history of present illness. Speech is intact and at baseline for patient, per patient. Patient appears to have a lisp at baseline. No aphasia or neglect noted. Naming, comprehension, repetition, and fluency are intact. Cranial Nerves:  II: PERRL 3mm. Visual fields full.  III, IV, VI: EOMI. Lid elevation symmetric and full.  V: Sensation is intact to light touch and symmetrical to face.  VII: Face is symmetric resting and smiling.  VIII: Hearing is intact to voice IX, X: Palate elevation is symmetric. Phonation normal.  XI: Normal sternocleidomastoid and trapezius muscle strength XII: Tongue protrudes midline without fasciculations.   Motor: 5/5 strength present in bilateral lower extremities, 5/5 strength of left upper extremity: biceps, triceps, shoulders, and grip strength. Right upper extremity: 4/5 with weakness in the distal hand. Decrease grip strength in R hand.  Diminished fine finger movements on the right.  Orbits left over right upper extremity.  Tone is normal. Bulk is normal.  Sensation: Intact to light touch bilaterally in all four extremities except decreased sensation to light touch on the right hand due to numbness. No extinction to DSS present.  Coordination: FTN intact bilaterally and proportional to weakness in RUE. No pronator drift.  Plantar: Toes downgoing bilaterally Gait- deferred  ASSESSMENT/PLAN Mr. Fernando Cooper is a 65 y.o. male with history of HTN, HLD, and recent Covid + presenting with R hand weakness and tingling and remains Covid +. Patient has no hx of CVA in the past  and was not on any anticoagulants. MRI confirmed multiple small acute infarcts in L MCA. Patient neurological exam is improving with minimal deficit.    Stroke: Multiple small acute infarcts in the left MCA region due to high-grade left middle cerebral artery intracranial stenosis.       Code Stroke : CT head No acute abnormality.  ASPECTS 10.   MRI   1. Cluster of small acute infarcts in the left MCA upper division watershed territory. There is focal high-grade narrowing at the left MCA bifurcation. 2. Irregularity of bilateral posterior cerebral arteries with moderate right P3 segment stenosis, presumably atheromatous.  MRA  Head and Neck Antegrade flow in the carotid and vertebral arteries.  Normal arch where covered, with 3 vessel branching. Mild left vertebral artery dominance. The vertebral arteries have a smooth and widely patent appearance.  The carotids also shows smoothly contoured lumen with no stenosis or ulceration.  2D Echo LVEF 60-65% no evidence of structural abnormalities that could increase risk for CVA  LDL 104  HgbA1c 5.6  VTE prophylaxis - Lovenox    Diet   Diet Heart Room service appropriate? Yes; Fluid consistency: Thin     No antithrombotic prior to admission, now on aspirin 81 mg daily and clopidogrel 75 mg daily. Take ASA+ Plavix for 3 months then ASA alone  Therapy recommendations:  OP OT  Disposition:  Home  Hypertension  Home meds:  Amlodipine 10mg   Can continue medications at discharge . Long-term BP goal normotensive  Hyperlipidemia  Home meds:  Pravastatin 20mg ,   LDL 104, goal < 70  Increased Pravastatin to 40mg  daily, continue at discharge   Other Stroke Risk Factors  Advanced Age >/= 25    Hospital day # 2  , MD PGY-1 I have personally obtained history,examined this patient, reviewed notes, independently viewed imaging studies, participated in medical decision making and plan of care.ROS completed  by me personally and pertinent positives fully documented  I have made any additions or clarifications directly to the above note. Agree with note above.  Patient presented with right sided weakness secondary to left MCA branch infarcts from high-grade left MCA stenosis.  Recommend dual antiplatelet therapy of aspirin and Plavix for 3 months followed by aspirin alone and aggressive risk factor modification.  Statin for elevated lipids.  Follow-up as an outpatient with stroke clinic in 6 weeks.  Long discussion patient and wife and answered questions.  Discussed with Dr. 76.  Greater than 50% time during this 35-minute visit was spent on counseling and coordination of care about his stroke and discussion about stroke prevention and treatment and answering questions.     Eliseo Gum, MD Medical Director [facility] Pager: [phone] 07/27/2020 2:47 PM  To contact Stroke Continuity provider, please refer to [phone]. After hours, contact General Neurology

## 2020-07-27 NOTE — Progress Notes (Signed)
Improved R hand strength compared to yesterday. Able to make a moderately strong fist. Sensation improved from whole hand numbness and tingling to only in the fingers.

## 2020-07-27 NOTE — Discharge Instructions (Signed)
Follow with Primary MD Dettinger, Elige Radon, MD in 7 days   Get CBC, CMP -  checked next visit within 1 week by Primary MD    Activity: As tolerated with Full fall precautions use walker/cane & assistance as needed  Disposition Home   Diet: Heart Healthy    Special Instructions: If you have smoked or chewed Tobacco  in the last 2 yrs please stop smoking, stop any regular Alcohol  and or any Recreational drug use.  On your next visit with your primary care physician please Get Medicines reviewed and adjusted.  Please request your Prim.MD to go over all Hospital Tests and Procedure/Radiological results at the follow up, please get all Hospital records sent to your Prim MD by signing hospital release before you go home.  If you experience worsening of your admission symptoms, develop shortness of breath, life threatening emergency, suicidal or homicidal thoughts you must seek medical attention immediately by calling 911 or calling your MD immediately  if symptoms less severe.  You Must read complete instructions/literature along with all the possible adverse reactions/side effects for all the Medicines you take and that have been prescribed to you. Take any new Medicines after you have completely understood and accpet all the possible adverse reactions/side effects.        Person Under Monitoring Name: Fernando Cooper  Location: [address] [place] [address]   Infection Prevention Recommendations for Individuals Confirmed to have, or Being Evaluated for, 2019 Novel Coronavirus (COVID-19) Infection Who Receive Care at Home  Individuals who are confirmed to have, or are being evaluated for, COVID-19 should follow the prevention steps below until a healthcare provider or local or state health department says they can return to normal activities.  Stay home except to get medical care You should restrict activities outside your home, except for getting medical care. Do not go to  work, school, or public areas, and do not use public transportation or taxis.  Call ahead before visiting your doctor Before your medical appointment, call the healthcare provider and tell them that you have, or are being evaluated for, COVID-19 infection. This will help the healthcare provider's office take steps to keep other people from getting infected. Ask your healthcare provider to call the local or state health department.  Monitor your symptoms Seek prompt medical attention if your illness is worsening (e.g., difficulty breathing). Before going to your medical appointment, call the healthcare provider and tell them that you have, or are being evaluated for, COVID-19 infection. Ask your healthcare provider to call the local or state health department.  Wear a facemask You should wear a facemask that covers your nose and mouth when you are in the same room with other people and when you visit a healthcare provider. People who live with or visit you should also wear a facemask while they are in the same room with you.  Separate yourself from other people in your home As much as possible, you should stay in a different room from other people in your home. Also, you should use a separate bathroom, if available.  Avoid sharing household items You should not share dishes, drinking glasses, cups, eating utensils, towels, bedding, or other items with other people in your home. After using these items, you should wash them thoroughly with soap and water.  Cover your coughs and sneezes Cover your mouth and nose with a tissue when you cough or sneeze, or you can cough or sneeze into your sleeve.  Throw used tissues in a lined trash can, and immediately wash your hands with soap and water for at least 20 seconds or use an alcohol-based hand rub.  Wash your [facility] your hands often and thoroughly with soap and water for at least 20 seconds. You can use an alcohol-based hand sanitizer if  soap and water are not available and if your hands are not visibly dirty. Avoid touching your eyes, nose, and mouth with unwashed hands.   Prevention Steps for Caregivers and Household Members of Individuals Confirmed to have, or Being Evaluated for, COVID-19 Infection Being Cared for in the Home  If you live with, or provide care at home for, a person confirmed to have, or being evaluated for, COVID-19 infection please follow these guidelines to prevent infection:  Follow healthcare provider's instructions Make sure that you understand and can help the patient follow any healthcare provider instructions for all care.  Provide for the patient's basic needs You should help the patient with basic needs in the home and provide support for getting groceries, prescriptions, and other personal needs.  Monitor the patient's symptoms If they are getting sicker, call his or her medical provider and tell them that the patient has, or is being evaluated for, COVID-19 infection. This will help the healthcare provider's office take steps to keep other people from getting infected. Ask the healthcare provider to call the local or state health department.  Limit the number of people who have contact with the patient  If possible, have only one caregiver for the patient.  Other household members should stay in another home or place of residence. If this is not possible, they should stay  in another room, or be separated from the patient as much as possible. Use a separate bathroom, if available.  Restrict visitors who do not have an essential need to be in the home.  Keep older adults, very young children, and other sick people away from the patient Keep older adults, very young children, and those who have compromised immune systems or chronic health conditions away from the patient. This includes people with chronic heart, lung, or kidney conditions, diabetes, and cancer.  Ensure good  ventilation Make sure that shared spaces in the home have good air flow, such as from an air conditioner or an opened window, weather permitting.  Wash your hands often  Wash your hands often and thoroughly with soap and water for at least 20 seconds. You can use an alcohol based hand sanitizer if soap and water are not available and if your hands are not visibly dirty.  Avoid touching your eyes, nose, and mouth with unwashed hands.  Use disposable paper towels to dry your hands. If not available, use dedicated cloth towels and replace them when they become wet.  Wear a facemask and gloves  Wear a disposable facemask at all times in the room and gloves when you touch or have contact with the patient's blood, body fluids, and/or secretions or excretions, such as sweat, saliva, sputum, nasal mucus, vomit, urine, or feces.  Ensure the mask fits over your nose and mouth tightly, and do not touch it during use.  Throw out disposable facemasks and gloves after using them. Do not reuse.  Wash your hands immediately after removing your facemask and gloves.  If your personal clothing becomes contaminated, carefully remove clothing and launder. Wash your hands after handling contaminated clothing.  Place all used disposable facemasks, gloves, and other waste in a lined container  before disposing them with other household waste.  Remove gloves and wash your hands immediately after handling these items.  Do not share dishes, glasses, or other household items with the patient  Avoid sharing household items. You should not share dishes, drinking glasses, cups, eating utensils, towels, bedding, or other items with a patient who is confirmed to have, or being evaluated for, COVID-19 infection.  After the person uses these items, you should wash them thoroughly with soap and water.  Wash laundry thoroughly  Immediately remove and wash clothes or bedding that have blood, body fluids, and/or  secretions or excretions, such as sweat, saliva, sputum, nasal mucus, vomit, urine, or feces, on them.  Wear gloves when handling laundry from the patient.  Read and follow directions on labels of laundry or clothing items and detergent. In general, wash and dry with the warmest temperatures recommended on the label.  Clean all areas the individual has used often  Clean all touchable surfaces, such as counters, tabletops, doorknobs, bathroom fixtures, toilets, phones, keyboards, tablets, and bedside tables, every day. Also, clean any surfaces that may have blood, body fluids, and/or secretions or excretions on them.  Wear gloves when cleaning surfaces the patient has come in contact with.  Use a diluted bleach solution (e.g., dilute bleach with 1 part bleach and 10 parts water) or a household disinfectant with a label that says EPA-registered for coronaviruses. To make a bleach solution at home, add 1 tablespoon of bleach to 1 quart (4 cups) of water. For a larger supply, add  cup of bleach to 1 gallon (16 cups) of water.  Read labels of cleaning products and follow recommendations provided on product labels. Labels contain instructions for safe and effective use of the cleaning product including precautions you should take when applying the product, such as wearing gloves or eye protection and making sure you have good ventilation during use of the product.  Remove gloves and wash hands immediately after cleaning.  Monitor yourself for signs and symptoms of illness Caregivers and household members are considered close contacts, should monitor their health, and will be asked to limit movement outside of the home to the extent possible. Follow the monitoring steps for close contacts listed on the symptom monitoring form.   ? If you have additional questions, contact your local health department or call the epidemiologist on call at [phone] (available 24/7). ? This guidance is subject  to change. For the most up-to-date guidance from [facility], please refer to their website: [email or web address]

## 2020-07-27 NOTE — Evaluation (Signed)
Speech Language Pathology Evaluation Patient Details Name: Fernando Cooper MRN: [id number] DOB: 02-May-1955 Today's Date: 07/27/2020 Time: 2725-3664 SLP Time Calculation (min) (ACUTE ONLY): 26 min  Problem List:  Patient Active Problem List   Diagnosis Date Noted  . Cerebral embolism with cerebral infarction 07/26/2020  . Right hand weakness 07/25/2020  . Essential hypertension, benign 03/17/2015  . Hyperlipemia 03/17/2015   Past Medical History:  Past Medical History:  Diagnosis Date  . Hyperlipidemia   . Hypertension    Past Surgical History:  Past Surgical History:  Procedure Laterality Date  . NOSE SURGERY     HPI:  Fernando Cooper is a 65 y.o. male with a medical history significant for hypertension and hyperlipidemia who presented to [facility] 4/23 for evaluation of right hand weakness.  MRI 4/24 revealed: "Cluster of small acute infarcts in the left MCA upper division watershed territory. There is focal high-grade narrowing at the left MCA bifurcation"   Assessment / Plan / Recommendation Clinical Impression  Pt presents with functional cognitive linguistic ability in setting of recent CVA.  Pt was assessed using the COGNISTAT (see below for additional information).  Pt denies any changes from his baseline.  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  He has no concerns about being able to resume his daily language or cognitive function. He denies any changes to articulation.  Pt performed within average range on all tasks assessed and has no further ST needs at this time.  SLP will sign off.   COGNISTAT: All subtests are within the average range, except where otherwise specified.  Orientation: 12/12 Attention: 7/8 Comprehension: 5/6 Repetition: 12/12 Naming: 7/8 Construction: not assessed Memory: 10/12 Calculations: 4/4 Similarities: 6/8 Judgment: 5/6    SLP Assessment  SLP Recommendation/Assessment: Patient does not need any further  Speech Lanaguage Pathology Services SLP Visit Diagnosis: Cognitive communication deficit (R41.841)    Follow Up Recommendations  None    Frequency and Duration   n/a        SLP Evaluation Cognition  Overall Cognitive Status: Within Functional Limits for tasks assessed Orientation Level: Oriented X4 Attention: Focused;Sustained Focused Attention: Appears intact Sustained Attention: Appears intact Memory: Appears intact Problem Solving: Appears intact Executive Function: Reasoning Reasoning: Appears intact       Comprehension  Auditory Comprehension Overall Auditory Comprehension: Appears within functional limits for tasks assessed Commands: Within Functional Limits    Expression Expression Primary Mode of Expression: Verbal Verbal Expression Overall Verbal Expression: Appears within functional limits for tasks assessed Repetition: No impairment Naming: No impairment   Oral / Motor  Oral Motor/Sensory Function Overall Oral Motor/Sensory Function: Within functional limits Motor Speech Overall Motor Speech: Appears within functional limits for tasks assessed Articulation:  (gliding of /r/ noted, not a change from baseline)   GO                    Kerrie Pleasure, MA, CCC-SLP Acute Rehabilitation Services Office: [phone]  07/27/2020, 11:41 AM

## 2020-07-27 NOTE — Discharge Summary (Signed)
Fernando Cooper ZOX:[id number] DOB: [birth year] DOA: 07/25/2020  PCP: Cooper, Elige Radon, MD  Admit date: 07/25/2020  Discharge date: 07/27/2020  Admitted From: Home   Disposition:  Home   Recommendations for Outpatient Follow-up:   Follow up with PCP in 1-2 weeks  PCP Please obtain BMP/CBC, 2 view CXR in 1week,  (see Discharge instructions)   PCP Please follow up on the following pending results: Monitor secondary risk factors for CVA   Home Health: PT, OT, RN Equipment/Devices: None Consultations: Neuro Discharge Condition: Stable    CODE STATUS: Full    Diet Recommendation: Heart Healthy   Diet Order            Diet - low sodium heart healthy           Diet Heart Room service appropriate? Yes; Fluid consistency: Thin  Diet effective now                  Chief Complaint  Patient presents with  . Numbness     Brief history of present illness from the day of admission and additional interim summary    Fernando Cooper a 64 y.o.malewith a history of hypertension, hyperlipidemia. Patient presents with right hand weakness that started around 12. Symptoms started astingling in right hand which progressed to weakness. As his symptoms are worsening, the patient presented to the hospital for evaluation. Symptom onset greater than 3 hours from time of presentation. No palliating or provoking factors.                                                                 Hospital Course      1. COVID-19 infection diagnosed 1 week ago and unvaccinated gentleman.    Per patient was diagnosed about 7-10 days ago, asymptomatic and incidental.  Could have caused mild hypercoagulable state causing the acute CVA.  2.  Mild right arm weakness.  CT head nonacute, however MRI confirmed an acute stroke, he was seen by  neurology and full stroke pathway was followed, his right arm weakness has improved but he still overall mildly weak in the right, he will receive aspirin Plavix along with higher than home dose statin.  Plan is 3 months of aspirin and statin followed by 81 mg of aspirin with higher dose statin for better LDL control.  Follow with PCP and neurology after discharge, home PT OT also ordered.  3.  Dyslipidemia.    Statin dose doubled for better control.  4.  Hypertension.  Allow for permissive hypertension, resume home Norvasc from 07/28/2020.  5.  Hypokalemia.  Replace and stable.  PCP to monitor.   Discharge diagnosis     Active Problems:   Essential hypertension, benign   Hyperlipemia   Right hand weakness   Cerebral embolism with  cerebral infarction    Discharge instructions    Discharge Instructions    Diet - low sodium heart healthy   Complete by: As directed    Discharge instructions   Complete by: As directed    Follow with Primary MD Cooper, Elige RadonJoshua A, MD in 7 days   Get CBC, CMP -  checked next visit within 1 week by Primary MD    Activity: As tolerated with Full fall precautions use walker/cane & assistance as needed  Disposition Home   Diet: Heart Healthy    Special Instructions: If you have smoked or chewed Tobacco  in the last 2 yrs please stop smoking, stop any regular Alcohol  and or any Recreational drug use.  On your next visit with your primary care physician please Get Medicines reviewed and adjusted.  Please request your Prim.MD to go over all Hospital Tests and Procedure/Radiological results at the follow up, please get all Hospital records sent to your Prim MD by signing hospital release before you go home.  If you experience worsening of your admission symptoms, develop shortness of breath, life threatening emergency, suicidal or homicidal thoughts you must seek medical attention immediately by calling 911 or calling your MD immediately  if symptoms  less severe.  You Must read complete instructions/literature along with all the possible adverse reactions/side effects for all the Medicines you take and that have been prescribed to you. Take any new Medicines after you have completely understood and accpet all the possible adverse reactions/side effects.   Increase activity slowly   Complete by: As directed       Discharge Medications   Allergies as of 07/27/2020   No Known Allergies     Medication List    TAKE these medications   amLODipine 10 MG tablet Commonly known as: NORVASC Take 1 tablet (10 mg total) by mouth daily. Start taking on: July 28, 2020   aspirin 81 MG EC tablet Take 1 tablet (81 mg total) by mouth daily. Swallow whole. Start taking on: July 28, 2020   clopidogrel 75 MG tablet Commonly known as: PLAVIX Take 1 tablet (75 mg total) by mouth daily. Start taking on: July 28, 2020   pantoprazole 40 MG tablet Commonly known as: Protonix Take 1 tablet (40 mg total) by mouth daily.   pravastatin 40 MG tablet Commonly known as: PRAVACHOL Take 1 tablet (40 mg total) by mouth daily. What changed:   medication strength  how much to take        Follow-up Information    Cooper, Elige RadonJoshua A, MD. Schedule an appointment as soon as possible for a visit in 1 week(s).   Specialties: Family Medicine, Cardiology Contact information: [address] [place] [address] [phone]        Micki RileySethi, Fernando S, MD. Schedule an appointment as soon as possible for a visit in 1 month(s).   Specialties: Neurology, Radiology Contact information: [address] [place] [address] [phone]               Major procedures and Radiology Reports - PLEASE review detailed and final reports thoroughly  -        MR ANGIO HEAD WO CONTRAST  Result Date: 07/26/2020 CLINICAL DATA:  Right hand weakness that started around noon EXAM: MR HEAD WITHOUT CONTRAST MR CIRCLE OF WILLIS WITHOUT CONTRAST MRA  OF THE NECK WITHOUT AND WITH CONTRAST TECHNIQUE: Multiplanar, multiecho pulse sequences of the brain, circle of willis and surrounding structures were  obtained without intravenous contrast. Angiographic images of the neck were obtained using MRA technique without and with intravenous contrast. CONTRAST:  7.69mL GADAVIST GADOBUTROL 1 MMOL/ML IV SOLN COMPARISON:  Head CT from yesterday FINDINGS: MR HEAD FINDINGS Brain: Scattered cortical and white matter infarcts along the high left frontal, parietal and occipital convexities, in the superior division watershed territory. No infarct seen in a different vascular territory. No acute hemorrhage, hydrocephalus, mass, or collection. Minor chronic white matter disease. Normal brain volume Vascular: See below Skull and upper cervical spine: Normal marrow signal Sinuses/Orbits: Negative MR CIRCLE OF WILLIS FINDINGS The carotid and vertebral arteries are smooth and widely patent. A small hypointense appearance in the central distal basilar is artifact based on postcontrast neck MRA which covers the same level. High-grade narrowing at the left MCA bifurcation, just beyond a superiorly directed M2 branch. Irregularity of bilateral PCA vessels, presumably atheromatous with up to moderate right P3 segment narrowing. MRA NECK FINDINGS Antegrade flow in the carotid and vertebral arteries. Normal arch where covered, with 3 vessel branching. Mild left vertebral artery dominance. The vertebral arteries have a smooth and widely patent appearance. The carotids also shows smoothly contoured lumen with no stenosis or ulceration. IMPRESSION: 1. Cluster of small acute infarcts in the left MCA upper division watershed territory. There is focal high-grade narrowing at the left MCA bifurcation. 2. Irregularity of bilateral posterior cerebral arteries with moderate right P3 segment stenosis, presumably atheromatous. Electronically Signed   By: Marnee Spring M.D.   On: 07/26/2020 11:35   MR  ANGIO NECK W WO CONTRAST  Result Date: 07/26/2020 CLINICAL DATA:  Right hand weakness that started around noon EXAM: MR HEAD WITHOUT CONTRAST MR CIRCLE OF WILLIS WITHOUT CONTRAST MRA OF THE NECK WITHOUT AND WITH CONTRAST TECHNIQUE: Multiplanar, multiecho pulse sequences of the brain, circle of willis and surrounding structures were obtained without intravenous contrast. Angiographic images of the neck were obtained using MRA technique without and with intravenous contrast. CONTRAST:  7.6mL GADAVIST GADOBUTROL 1 MMOL/ML IV SOLN COMPARISON:  Head CT from yesterday FINDINGS: MR HEAD FINDINGS Brain: Scattered cortical and white matter infarcts along the high left frontal, parietal and occipital convexities, in the superior division watershed territory. No infarct seen in a different vascular territory. No acute hemorrhage, hydrocephalus, mass, or collection. Minor chronic white matter disease. Normal brain volume Vascular: See below Skull and upper cervical spine: Normal marrow signal Sinuses/Orbits: Negative MR CIRCLE OF WILLIS FINDINGS The carotid and vertebral arteries are smooth and widely patent. A small hypointense appearance in the central distal basilar is artifact based on postcontrast neck MRA which covers the same level. High-grade narrowing at the left MCA bifurcation, just beyond a superiorly directed M2 branch. Irregularity of bilateral PCA vessels, presumably atheromatous with up to moderate right P3 segment narrowing. MRA NECK FINDINGS Antegrade flow in the carotid and vertebral arteries. Normal arch where covered, with 3 vessel branching. Mild left vertebral artery dominance. The vertebral arteries have a smooth and widely patent appearance. The carotids also shows smoothly contoured lumen with no stenosis or ulceration. IMPRESSION: 1. Cluster of small acute infarcts in the left MCA upper division watershed territory. There is focal high-grade narrowing at the left MCA bifurcation. 2. Irregularity of  bilateral posterior cerebral arteries with moderate right P3 segment stenosis, presumably atheromatous. Electronically Signed   By: Marnee Spring M.D.   On: 07/26/2020 11:35   MR BRAIN WO CONTRAST  Result Date: 07/26/2020 CLINICAL DATA:  Right hand weakness that started around noon  EXAM: MR HEAD WITHOUT CONTRAST MR CIRCLE OF WILLIS WITHOUT CONTRAST MRA OF THE NECK WITHOUT AND WITH CONTRAST TECHNIQUE: Multiplanar, multiecho pulse sequences of the brain, circle of willis and surrounding structures were obtained without intravenous contrast. Angiographic images of the neck were obtained using MRA technique without and with intravenous contrast. CONTRAST:  7.85mL GADAVIST GADOBUTROL 1 MMOL/ML IV SOLN COMPARISON:  Head CT from yesterday FINDINGS: MR HEAD FINDINGS Brain: Scattered cortical and white matter infarcts along the high left frontal, parietal and occipital convexities, in the superior division watershed territory. No infarct seen in a different vascular territory. No acute hemorrhage, hydrocephalus, mass, or collection. Minor chronic white matter disease. Normal brain volume Vascular: See below Skull and upper cervical spine: Normal marrow signal Sinuses/Orbits: Negative MR CIRCLE OF WILLIS FINDINGS The carotid and vertebral arteries are smooth and widely patent. A small hypointense appearance in the central distal basilar is artifact based on postcontrast neck MRA which covers the same level. High-grade narrowing at the left MCA bifurcation, just beyond a superiorly directed M2 branch. Irregularity of bilateral PCA vessels, presumably atheromatous with up to moderate right P3 segment narrowing. MRA NECK FINDINGS Antegrade flow in the carotid and vertebral arteries. Normal arch where covered, with 3 vessel branching. Mild left vertebral artery dominance. The vertebral arteries have a smooth and widely patent appearance. The carotids also shows smoothly contoured lumen with no stenosis or ulceration.  IMPRESSION: 1. Cluster of small acute infarcts in the left MCA upper division watershed territory. There is focal high-grade narrowing at the left MCA bifurcation. 2. Irregularity of bilateral posterior cerebral arteries with moderate right P3 segment stenosis, presumably atheromatous. Electronically Signed   By: Marnee Spring M.D.   On: 07/26/2020 11:35   DG Chest Port 1 View  Result Date: 07/26/2020 CLINICAL DATA:  Shortness of breath EXAM: PORTABLE CHEST 1 VIEW COMPARISON:  None. FINDINGS: Artifact from EKG leads. There is no edema, consolidation, effusion, or pneumothorax. Normal heart size and mediastinal contours. IMPRESSION: Negative portable chest. Electronically Signed   By: Marnee Spring M.D.   On: 07/26/2020 08:33   CT HEAD CODE STROKE WO CONTRAST  Result Date: 07/25/2020 CLINICAL DATA:  Code stroke.  Right hand numbness and weakness EXAM: CT HEAD WITHOUT CONTRAST TECHNIQUE: Contiguous axial images were obtained from the base of the skull through the vertex without intravenous contrast. COMPARISON:  None. FINDINGS: Brain: There is no mass, hemorrhage or extra-axial collection. The size and configuration of the ventricles and extra-axial CSF spaces are normal. The brain parenchyma is normal, without evidence of acute or chronic infarction. Vascular: No abnormal hyperdensity of the major intracranial arteries or dural venous sinuses. No intracranial atherosclerosis. Skull: The visualized skull base, calvarium and extracranial soft tissues are normal. Sinuses/Orbits: No fluid levels or advanced mucosal thickening of the visualized paranasal sinuses. No mastoid or middle ear effusion. The orbits are normal. ASPECTS [facility] Stroke Program Early CT Score) - Ganglionic level infarction (caudate, lentiform nuclei, internal capsule, insula, M1-M3 cortex): 7 - Supraganglionic infarction (M4-M6 cortex): 3 Total score (0-10 with 10 being normal): 10 IMPRESSION: 1. Normal head CT. 2. ASPECTS is 10. These  results were called by telephone at the time of interpretation on 07/25/2020 at 8:10 pm to provider [facility], who verbally acknowledged these results. Electronically Signed   By: Deatra Robinson M.D.   On: 07/25/2020 20:12   ECHOCARDIOGRAM LIMITED  Result Date: 07/26/2020    ECHOCARDIOGRAM LIMITED REPORT   Patient Name:   DEMICHAEL TRAUM Date  of Exam: 07/26/2020 Medical Rec #:  [id number]    Height:       70.0 in Accession #:    [id number]   Weight:       179.7 lb Date of Birth:  Apr 15, 1955   BSA:          1.994 m Patient Age:    64 years     BP:           132/82 mmHg Patient Gender: M            HR:           67 bpm. Exam Location:  Inpatient Procedure: Limited Color Doppler, Cardiac Doppler and Limited Echo Indications:    TIA  History:        Patient has no prior history of Echocardiogram examinations.                 Risk Factors:Hypertension and Dyslipidemia.  Sonographer:    Delcie Roch Referring Phys: [phone] JACOB J STINSON  Sonographer Comments: Suboptimal subcostal window. IMPRESSIONS  1. Left ventricular ejection fraction, by estimation, is 60 to 65%. The left ventricle has normal function. The left ventricle has no regional wall motion abnormalities.  2. Right ventricular systolic function is normal. The right ventricular size is normal. Tricuspid regurgitation signal is inadequate for assessing PA pressure.  3. The mitral valve is normal in structure. Trivial mitral valve regurgitation. No evidence of mitral stenosis.  4. The aortic valve is tricuspid. Aortic valve regurgitation is not visualized. Mild aortic valve sclerosis is present, with no evidence of aortic valve stenosis.  5. The inferior vena cava is normal in size with greater than 50% respiratory variability, suggesting right atrial pressure of 3 mmHg. FINDINGS  Left Ventricle: Left ventricular ejection fraction, by estimation, is 60 to 65%. The left ventricle has normal function. The left ventricle has no regional wall motion  abnormalities. The left ventricular internal cavity size was normal in size. There is  no left ventricular hypertrophy. Right Ventricle: The right ventricular size is normal. No increase in right ventricular wall thickness. Right ventricular systolic function is normal. Tricuspid regurgitation signal is inadequate for assessing PA pressure. Left Atrium: Left atrial size was normal in size. Right Atrium: Right atrial size was normal in size. Pericardium: There is no evidence of pericardial effusion. Mitral Valve: The mitral valve is normal in structure. Trivial mitral valve regurgitation. No evidence of mitral valve stenosis. Tricuspid Valve: The tricuspid valve is normal in structure. Tricuspid valve regurgitation is trivial. No evidence of tricuspid stenosis. Aortic Valve: The aortic valve is tricuspid. Aortic valve regurgitation is not visualized. Mild aortic valve sclerosis is present, with no evidence of aortic valve stenosis. Pulmonic Valve: The pulmonic valve was normal in structure. Pulmonic valve regurgitation is not visualized. No evidence of pulmonic stenosis. Aorta: The aortic root is normal in size and structure. Venous: The inferior vena cava was not well visualized. The inferior vena cava is normal in size with greater than 50% respiratory variability, suggesting right atrial pressure of 3 mmHg. IAS/Shunts: No atrial level shunt detected by color flow Doppler. LEFT VENTRICLE PLAX 2D LVIDd:         4.30 cm LVIDs:         2.40 cm LV PW:         1.00 cm LV IVS:        0.90 cm LVOT diam:     2.00 cm LV SV:  65 LV SV Index:   33 LVOT Area:     3.14 cm  LEFT ATRIUM         Index LA diam:    3.50 cm 1.75 cm/m  AORTIC VALVE LVOT Vmax:   101.00 cm/s LVOT Vmean:  66.400 cm/s LVOT VTI:    0.207 m  AORTA Ao Asc diam: 3.10 cm MITRAL VALVE               TRICUSPID VALVE MV Area (PHT): 3.48 cm    TR Peak grad:   16.3 mmHg MV Decel Time: 218 msec    TR Vmax:        202.00 cm/s MV E velocity: 54.80 cm/s MV A  velocity: 73.30 cm/s  SHUNTS MV E/A ratio:  0.75        Systemic VTI:  0.21 m                            Systemic Diam: 2.00 cm Armanda Magic MD Electronically signed by Armanda Magic MD Signature Date/Time: 07/26/2020/10:12:21 PM    Final     Micro Results     Recent Results (from the past 240 hour(s))  Resp Panel by RT-PCR (Flu A&B, Covid) Nasopharyngeal Swab     Status: Abnormal   Collection Time: 07/25/20  7:48 PM   Specimen: Nasopharyngeal Swab; Nasopharyngeal(NP) swabs in vial transport medium  Result Value Ref Range Status   SARS Coronavirus 2 by RT PCR POSITIVE (A) NEGATIVE Final    Comment: CRITICAL RESULT CALLED TO, READ BACK BY AND VERIFIED WITH: HARRY,L 2107 07/25/2020 COLEMAN,R (NOTE) SARS-CoV-2 target nucleic acids are DETECTED.  The SARS-CoV-2 RNA is generally detectable in upper respiratory specimens during the acute phase of infection. Positive results are indicative of the presence of the identified virus, but do not rule out bacterial infection or co-infection with other pathogens not detected by the test. Clinical correlation with patient history and other diagnostic information is necessary to determine patient infection status. The expected result is Negative.  Fact Sheet for Patients: BloggerCourse.com  Fact Sheet for Healthcare Providers: SeriousBroker.it  This test is not yet approved or cleared by the [place] FDA and  has been authorized for detection and/or diagnosis of SARS-CoV-2 by FDA under an Emergency Use Authorization (EUA).  This EUA will remain in effect (meaning this te st can be used) for the duration of  the COVID-19 declaration under Section 564(b)(1) of the Act, 21 U.S.C. section 360bbb-3(b)(1), unless the authorization is terminated or revoked sooner.     Influenza A by PCR NEGATIVE NEGATIVE Final   Influenza B by PCR NEGATIVE NEGATIVE Final    Comment: (NOTE) The Xpert Xpress  SARS-CoV-2/FLU/RSV plus assay is intended as an aid in the diagnosis of influenza from Nasopharyngeal swab specimens and should not be used as a sole basis for treatment. Nasal washings and aspirates are unacceptable for Xpert Xpress SARS-CoV-2/FLU/RSV testing.  Fact Sheet for Patients: BloggerCourse.com  Fact Sheet for Healthcare Providers: SeriousBroker.it  This test is not yet approved or cleared by the [place] FDA and has been authorized for detection and/or diagnosis of SARS-CoV-2 by FDA under an Emergency Use Authorization (EUA). This EUA will remain in effect (meaning this test can be used) for the duration of the COVID-19 declaration under Section 564(b)(1) of the Act, 21 U.S.C. section 360bbb-3(b)(1), unless the authorization is terminated or revoked.  Performed at [facility], [address], [place],  [place] [address]     Today   Subjective    Fernando Cooper today has no headache,no chest abdominal pain,no new weakness tingling or numbness, feels much better wants to go home today.     Objective   Blood pressure 121/67, pulse 63, temperature 97.9 F (36.6 C), temperature source Oral, resp. rate 18, height 5\' 10"  (1.778 m), weight 81.5 kg, SpO2 99 %.   Intake/Output Summary (Last 24 hours) at 07/27/2020 1151 Last data filed at 07/27/2020 0400 Gross per 24 hour  Intake 300 ml  Output --  Net 300 ml    Exam  Awake Alert, No new F.N deficits, still has R hand weakness [place].AT,PERRAL Supple Neck,No JVD, No cervical lymphadenopathy appriciated.  Symmetrical Chest wall movement, Good air movement bilaterally, CTAB RRR,No Gallops,Rubs or new Murmurs, No Parasternal Heave +ve B.Sounds, Abd Soft, Non tender, No organomegaly appriciated, No rebound -guarding or rigidity. No Cyanosis, Clubbing or edema, No new Rash or bruise   Data Review   CBC w Diff:  Lab Results  Component Value Date   WBC 7.5 07/27/2020    HGB 14.7 07/27/2020   HGB 15.3 09/13/2019   HCT 42.9 07/27/2020   HCT 45.5 09/13/2019   PLT 284 07/27/2020   PLT 250 09/13/2019   LYMPHOPCT 22 07/27/2020   MONOPCT 7 07/27/2020   EOSPCT 1 07/27/2020   BASOPCT 0 07/27/2020    CMP:  Lab Results  Component Value Date   NA 138 07/27/2020   NA 138 09/13/2019   K 3.8 07/27/2020   CL 105 07/27/2020   CO2 26 07/27/2020   BUN 7 (L) 07/27/2020   BUN 9 09/13/2019   CREATININE 1.01 07/27/2020   PROT 5.8 (L) 07/27/2020   PROT 6.7 09/13/2019   ALBUMIN 3.5 07/27/2020   ALBUMIN 4.4 09/13/2019   BILITOT 1.6 (H) 07/27/2020   BILITOT 1.1 09/13/2019   ALKPHOS 84 07/27/2020   AST 15 07/27/2020   ALT 23 07/27/2020  . Lab Results  Component Value Date   HGBA1C 5.6 07/26/2020   Lab Results  Component Value Date   CHOL 154 07/26/2020   HDL 29 (L) 07/26/2020   LDLCALC 104 (H) 07/26/2020   TRIG 107 07/26/2020   CHOLHDL 5.3 07/26/2020     Total Time in preparing paper work, data evaluation and todays exam - 35 minutes  07/28/2020 M.D on 07/27/2020 at 11:51 AM  [facility]

## 2020-07-27 NOTE — Progress Notes (Signed)
Fernando Cooper DC'd  Home per MD order. D/C instructions discussed with the patient and questions answered.  An After Visit Summary was printed and given to the patient.  RN ambulated with patient for D/C home via private auto. Patient belongings and valuables sent with patient.  [name]  07/27/2020 3:03 PM

## 2020-07-28 ENCOUNTER — Telehealth: Payer: Self-pay

## 2020-07-28 DIAGNOSIS — I10 Essential (primary) hypertension: Secondary | ICD-10-CM | POA: Diagnosis not present

## 2020-07-28 DIAGNOSIS — K219 Gastro-esophageal reflux disease without esophagitis: Secondary | ICD-10-CM | POA: Diagnosis not present

## 2020-07-28 DIAGNOSIS — I63312 Cerebral infarction due to thrombosis of left middle cerebral artery: Secondary | ICD-10-CM | POA: Diagnosis not present

## 2020-07-28 DIAGNOSIS — Z6826 Body mass index (BMI) 26.0-26.9, adult: Secondary | ICD-10-CM | POA: Diagnosis not present

## 2020-07-28 NOTE — Telephone Encounter (Signed)
Transition Care Management Unsuccessful Follow-up Telephone Call  Date of discharge and from where:  07/27/20 from [facility]  Attempts:  1st Attempt  Reason for unsuccessful TCM follow-up call:  Left voice message

## 2020-07-29 ENCOUNTER — Ambulatory Visit: Payer: Medicare Other | Admitting: Nurse Practitioner

## 2020-07-29 NOTE — Telephone Encounter (Signed)
Transition Care Management Unsuccessful Follow-up Telephone Call  Date of discharge and from where:  07/27/20 from [facility]  Attempts:  2nd Attempt  Reason for unsuccessful TCM follow-up call:  Unable to leave message voice mail full   Transition Care Management Unsuccessful Follow-up Telephone Call  Date of discharge and from where:  4/25 [facility]  Attempts:  3rd Attempt  Reason for unsuccessful TCM follow-up call:  Left voice message for patient for the 2nd time

## 2020-08-05 DIAGNOSIS — M25641 Stiffness of right hand, not elsewhere classified: Secondary | ICD-10-CM | POA: Diagnosis not present

## 2020-08-05 DIAGNOSIS — R531 Weakness: Secondary | ICD-10-CM | POA: Diagnosis not present

## 2020-08-05 DIAGNOSIS — R202 Paresthesia of skin: Secondary | ICD-10-CM | POA: Diagnosis not present

## 2020-08-05 DIAGNOSIS — R278 Other lack of coordination: Secondary | ICD-10-CM | POA: Diagnosis not present

## 2020-08-07 DIAGNOSIS — M25641 Stiffness of right hand, not elsewhere classified: Secondary | ICD-10-CM | POA: Diagnosis not present

## 2020-08-07 DIAGNOSIS — R278 Other lack of coordination: Secondary | ICD-10-CM | POA: Diagnosis not present

## 2020-08-07 DIAGNOSIS — R531 Weakness: Secondary | ICD-10-CM | POA: Diagnosis not present

## 2020-08-07 DIAGNOSIS — R202 Paresthesia of skin: Secondary | ICD-10-CM | POA: Diagnosis not present

## 2020-08-10 DIAGNOSIS — L0291 Cutaneous abscess, unspecified: Secondary | ICD-10-CM | POA: Diagnosis not present

## 2020-08-10 DIAGNOSIS — Z6826 Body mass index (BMI) 26.0-26.9, adult: Secondary | ICD-10-CM | POA: Diagnosis not present

## 2020-08-14 DIAGNOSIS — M25641 Stiffness of right hand, not elsewhere classified: Secondary | ICD-10-CM | POA: Diagnosis not present

## 2020-08-14 DIAGNOSIS — R202 Paresthesia of skin: Secondary | ICD-10-CM | POA: Diagnosis not present

## 2020-08-14 DIAGNOSIS — R531 Weakness: Secondary | ICD-10-CM | POA: Diagnosis not present

## 2020-08-14 DIAGNOSIS — R278 Other lack of coordination: Secondary | ICD-10-CM | POA: Diagnosis not present

## 2020-08-17 DIAGNOSIS — R531 Weakness: Secondary | ICD-10-CM | POA: Diagnosis not present

## 2020-08-17 DIAGNOSIS — M25641 Stiffness of right hand, not elsewhere classified: Secondary | ICD-10-CM | POA: Diagnosis not present

## 2020-08-17 DIAGNOSIS — R202 Paresthesia of skin: Secondary | ICD-10-CM | POA: Diagnosis not present

## 2020-08-17 DIAGNOSIS — R278 Other lack of coordination: Secondary | ICD-10-CM | POA: Diagnosis not present

## 2020-08-19 DIAGNOSIS — R278 Other lack of coordination: Secondary | ICD-10-CM | POA: Diagnosis not present

## 2020-08-19 DIAGNOSIS — M25641 Stiffness of right hand, not elsewhere classified: Secondary | ICD-10-CM | POA: Diagnosis not present

## 2020-08-19 DIAGNOSIS — R531 Weakness: Secondary | ICD-10-CM | POA: Diagnosis not present

## 2020-08-19 DIAGNOSIS — R202 Paresthesia of skin: Secondary | ICD-10-CM | POA: Diagnosis not present

## 2020-08-24 DIAGNOSIS — R202 Paresthesia of skin: Secondary | ICD-10-CM | POA: Diagnosis not present

## 2020-08-24 DIAGNOSIS — R278 Other lack of coordination: Secondary | ICD-10-CM | POA: Diagnosis not present

## 2020-08-24 DIAGNOSIS — R531 Weakness: Secondary | ICD-10-CM | POA: Diagnosis not present

## 2020-08-24 DIAGNOSIS — M25641 Stiffness of right hand, not elsewhere classified: Secondary | ICD-10-CM | POA: Diagnosis not present

## 2020-08-26 DIAGNOSIS — R531 Weakness: Secondary | ICD-10-CM | POA: Diagnosis not present

## 2020-08-26 DIAGNOSIS — M25641 Stiffness of right hand, not elsewhere classified: Secondary | ICD-10-CM | POA: Diagnosis not present

## 2020-08-26 DIAGNOSIS — R278 Other lack of coordination: Secondary | ICD-10-CM | POA: Diagnosis not present

## 2020-08-26 DIAGNOSIS — R202 Paresthesia of skin: Secondary | ICD-10-CM | POA: Diagnosis not present

## 2020-09-01 DIAGNOSIS — R202 Paresthesia of skin: Secondary | ICD-10-CM | POA: Diagnosis not present

## 2020-09-01 DIAGNOSIS — R531 Weakness: Secondary | ICD-10-CM | POA: Diagnosis not present

## 2020-09-01 DIAGNOSIS — R278 Other lack of coordination: Secondary | ICD-10-CM | POA: Diagnosis not present

## 2020-09-01 DIAGNOSIS — M25641 Stiffness of right hand, not elsewhere classified: Secondary | ICD-10-CM | POA: Diagnosis not present

## 2020-09-02 DIAGNOSIS — M25641 Stiffness of right hand, not elsewhere classified: Secondary | ICD-10-CM | POA: Diagnosis not present

## 2020-09-02 DIAGNOSIS — R531 Weakness: Secondary | ICD-10-CM | POA: Diagnosis not present

## 2020-09-02 DIAGNOSIS — R202 Paresthesia of skin: Secondary | ICD-10-CM | POA: Diagnosis not present

## 2020-09-02 DIAGNOSIS — R278 Other lack of coordination: Secondary | ICD-10-CM | POA: Diagnosis not present

## 2020-09-07 DIAGNOSIS — M25641 Stiffness of right hand, not elsewhere classified: Secondary | ICD-10-CM | POA: Diagnosis not present

## 2020-09-07 DIAGNOSIS — R202 Paresthesia of skin: Secondary | ICD-10-CM | POA: Diagnosis not present

## 2020-09-07 DIAGNOSIS — R278 Other lack of coordination: Secondary | ICD-10-CM | POA: Diagnosis not present

## 2020-09-07 DIAGNOSIS — R531 Weakness: Secondary | ICD-10-CM | POA: Diagnosis not present

## 2020-09-09 DIAGNOSIS — M25641 Stiffness of right hand, not elsewhere classified: Secondary | ICD-10-CM | POA: Diagnosis not present

## 2020-09-09 DIAGNOSIS — R202 Paresthesia of skin: Secondary | ICD-10-CM | POA: Diagnosis not present

## 2020-09-09 DIAGNOSIS — R278 Other lack of coordination: Secondary | ICD-10-CM | POA: Diagnosis not present

## 2020-09-09 DIAGNOSIS — R531 Weakness: Secondary | ICD-10-CM | POA: Diagnosis not present

## 2020-10-26 DIAGNOSIS — I10 Essential (primary) hypertension: Secondary | ICD-10-CM | POA: Diagnosis not present

## 2020-10-26 DIAGNOSIS — I63312 Cerebral infarction due to thrombosis of left middle cerebral artery: Secondary | ICD-10-CM | POA: Diagnosis not present

## 2020-10-26 DIAGNOSIS — Z6825 Body mass index (BMI) 25.0-25.9, adult: Secondary | ICD-10-CM | POA: Diagnosis not present

## 2020-10-26 DIAGNOSIS — K219 Gastro-esophageal reflux disease without esophagitis: Secondary | ICD-10-CM | POA: Diagnosis not present

## 2021-01-25 DIAGNOSIS — Z8673 Personal history of transient ischemic attack (TIA), and cerebral infarction without residual deficits: Secondary | ICD-10-CM | POA: Diagnosis not present

## 2021-01-25 DIAGNOSIS — K219 Gastro-esophageal reflux disease without esophagitis: Secondary | ICD-10-CM | POA: Diagnosis not present

## 2021-01-25 DIAGNOSIS — I10 Essential (primary) hypertension: Secondary | ICD-10-CM | POA: Diagnosis not present

## 2021-01-25 DIAGNOSIS — Z6827 Body mass index (BMI) 27.0-27.9, adult: Secondary | ICD-10-CM | POA: Diagnosis not present

## 2021-01-29 DIAGNOSIS — Z125 Encounter for screening for malignant neoplasm of prostate: Secondary | ICD-10-CM | POA: Diagnosis not present

## 2021-01-29 DIAGNOSIS — R5383 Other fatigue: Secondary | ICD-10-CM | POA: Diagnosis not present

## 2021-01-29 DIAGNOSIS — E782 Mixed hyperlipidemia: Secondary | ICD-10-CM | POA: Diagnosis not present

## 2021-01-29 DIAGNOSIS — Z79899 Other long term (current) drug therapy: Secondary | ICD-10-CM | POA: Diagnosis not present

## 2021-03-01 ENCOUNTER — Telehealth: Payer: Self-pay | Admitting: Family Medicine

## 2021-04-28 DIAGNOSIS — I10 Essential (primary) hypertension: Secondary | ICD-10-CM | POA: Diagnosis not present

## 2021-04-28 DIAGNOSIS — Z8673 Personal history of transient ischemic attack (TIA), and cerebral infarction without residual deficits: Secondary | ICD-10-CM | POA: Diagnosis not present

## 2021-04-28 DIAGNOSIS — K219 Gastro-esophageal reflux disease without esophagitis: Secondary | ICD-10-CM | POA: Diagnosis not present

## 2021-04-28 DIAGNOSIS — Z6827 Body mass index (BMI) 27.0-27.9, adult: Secondary | ICD-10-CM | POA: Diagnosis not present

## 2021-08-26 DIAGNOSIS — K219 Gastro-esophageal reflux disease without esophagitis: Secondary | ICD-10-CM | POA: Diagnosis not present

## 2021-08-26 DIAGNOSIS — Z6827 Body mass index (BMI) 27.0-27.9, adult: Secondary | ICD-10-CM | POA: Diagnosis not present

## 2021-08-26 DIAGNOSIS — I10 Essential (primary) hypertension: Secondary | ICD-10-CM | POA: Diagnosis not present

## 2021-08-26 DIAGNOSIS — Z8673 Personal history of transient ischemic attack (TIA), and cerebral infarction without residual deficits: Secondary | ICD-10-CM | POA: Diagnosis not present

## 2021-08-27 DIAGNOSIS — Z0000 Encounter for general adult medical examination without abnormal findings: Secondary | ICD-10-CM | POA: Diagnosis not present

## 2021-08-27 DIAGNOSIS — Z8673 Personal history of transient ischemic attack (TIA), and cerebral infarction without residual deficits: Secondary | ICD-10-CM | POA: Diagnosis not present

## 2021-08-27 DIAGNOSIS — K219 Gastro-esophageal reflux disease without esophagitis: Secondary | ICD-10-CM | POA: Diagnosis not present

## 2021-08-27 DIAGNOSIS — I10 Essential (primary) hypertension: Secondary | ICD-10-CM | POA: Diagnosis not present

## 2021-12-30 DIAGNOSIS — I10 Essential (primary) hypertension: Secondary | ICD-10-CM | POA: Diagnosis not present

## 2021-12-30 DIAGNOSIS — Z8673 Personal history of transient ischemic attack (TIA), and cerebral infarction without residual deficits: Secondary | ICD-10-CM | POA: Diagnosis not present

## 2021-12-30 DIAGNOSIS — K219 Gastro-esophageal reflux disease without esophagitis: Secondary | ICD-10-CM | POA: Diagnosis not present

## 2021-12-30 DIAGNOSIS — Z6827 Body mass index (BMI) 27.0-27.9, adult: Secondary | ICD-10-CM | POA: Diagnosis not present

## 2022-03-25 IMAGING — MR MR MRA HEAD W/O CM
1 series · 19 of 48 positions shown · IV contrast (gadavist)
Comparison: Head CT from yesterday

CLINICAL DATA: Right hand weakness that started around noon

EXAM:
MR HEAD WITHOUT CONTRAST
MR CIRCLE OF WILLIS WITHOUT CONTRAST
MRA OF THE NECK WITHOUT AND WITH CONTRAST
TECHNIQUE: Multiplanar, multiecho pulse sequences of the brain, circle of
willis and surrounding structures were obtained without intravenous
contrast. Angiographic images of the neck were obtained using MRA
technique without and with intravenous contrast.
CONTRAST:  7.5mL GADAVIST GADOBUTROL 1 MMOL/ML IV SOLN

[Series 5: 3d cow · axial · 0.5mm · 0.41mm/px · z∈[-34,+56]mm · 19 of 188 slices shown]
[im 1/188]
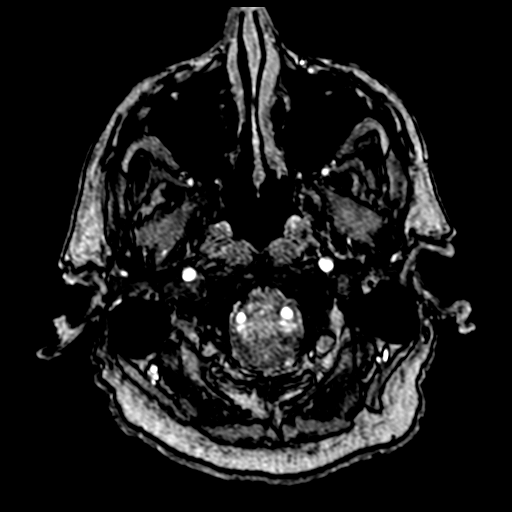
[im 4/188]
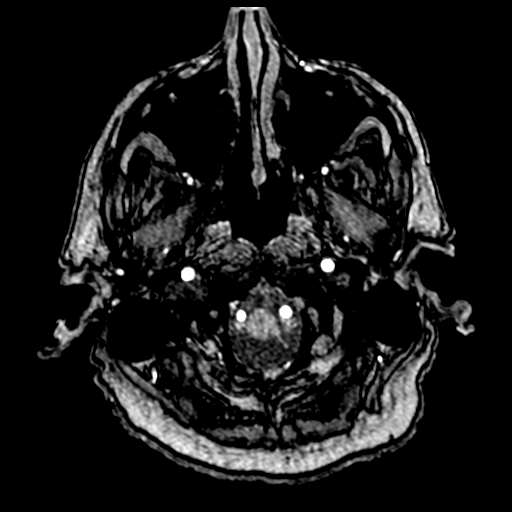
[im 8/188]
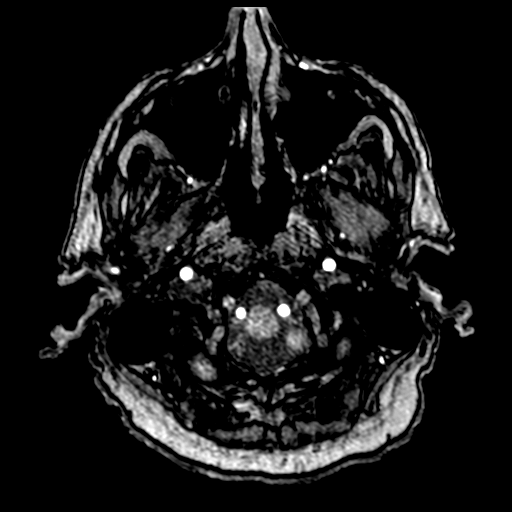
[im 12/188]
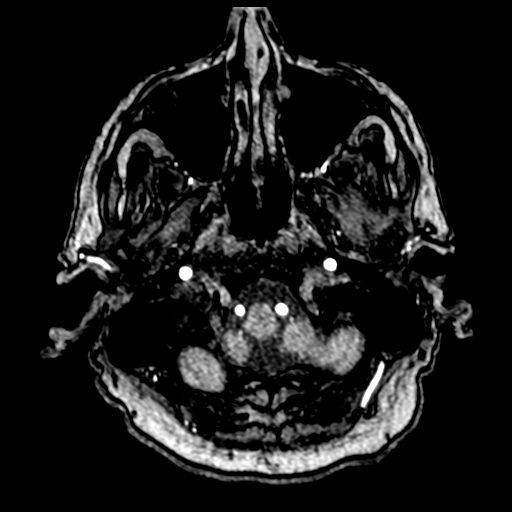
[im 16/188]
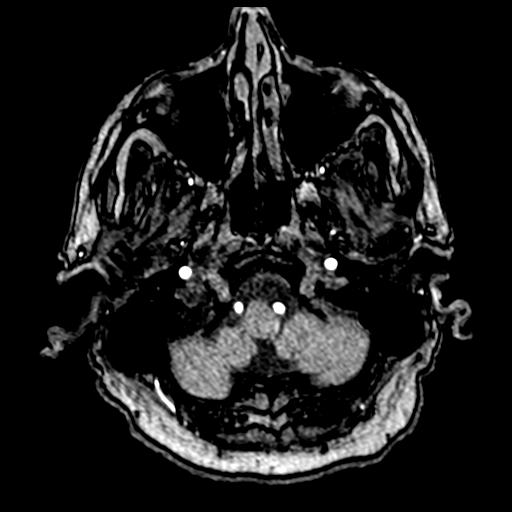
[im 20/188]
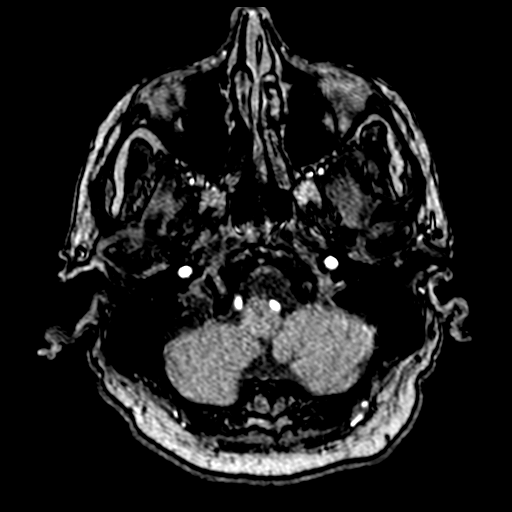
[im 24/188]
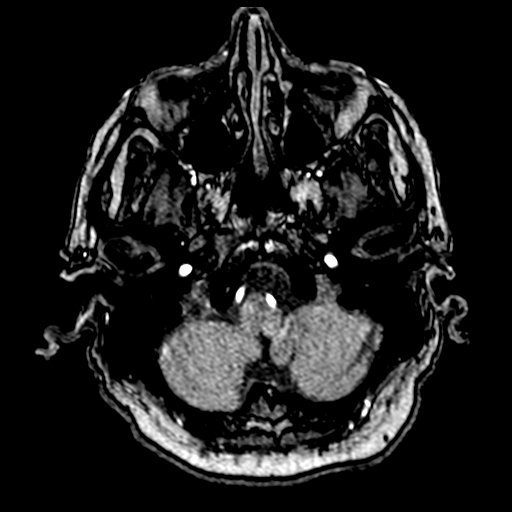
[im 28/188]
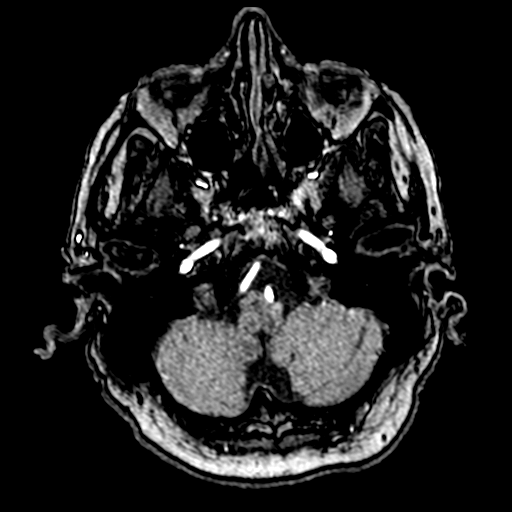
[im 32/188]
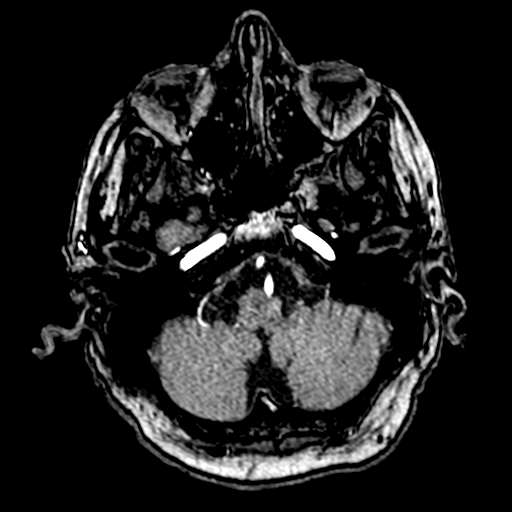
[im 36/188]
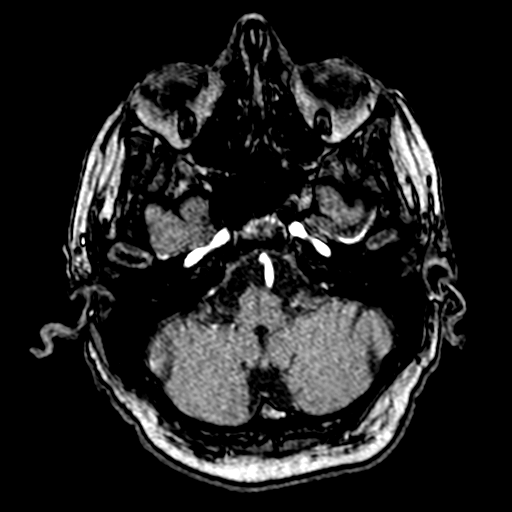
[im 40/188]
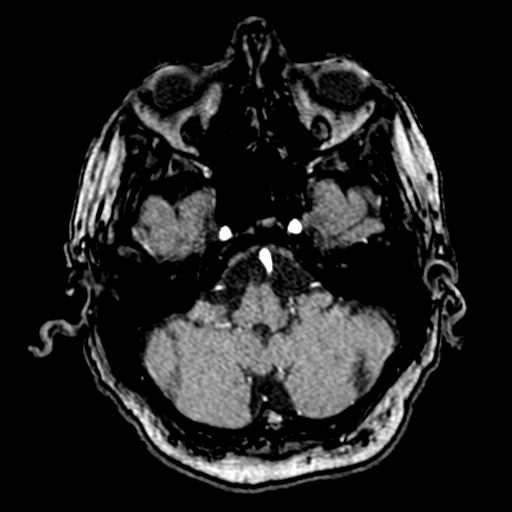
[im 60/188]
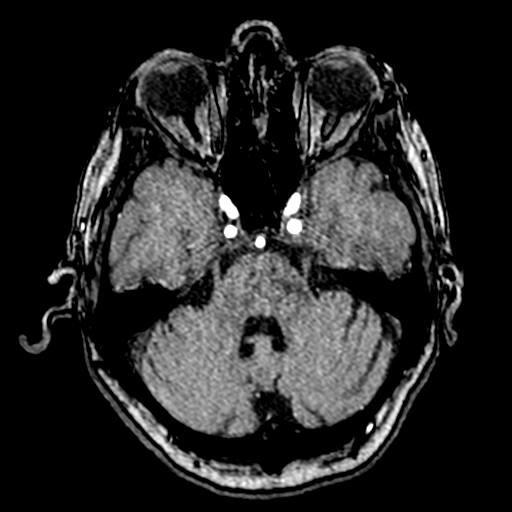
[im 84/188]
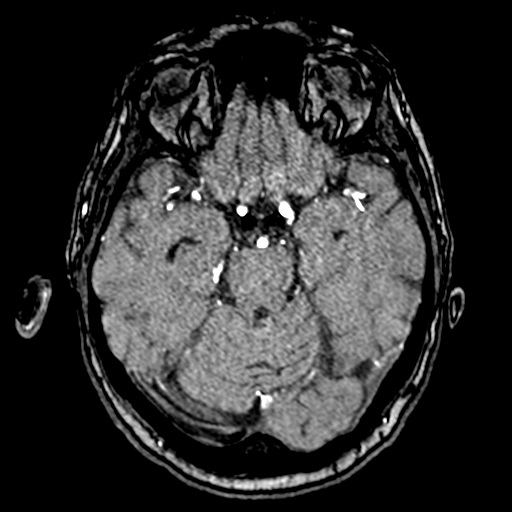
[im 96/188]
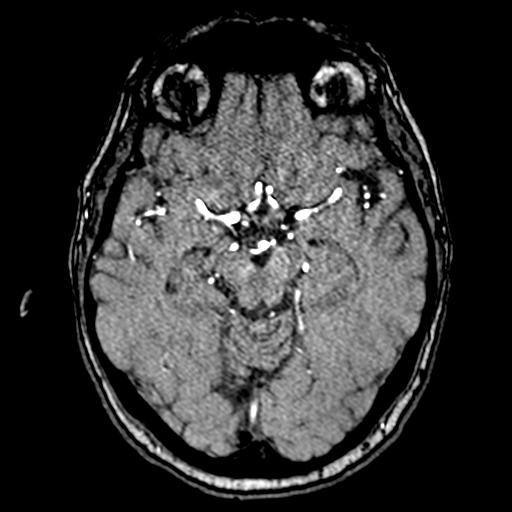
[im 108/188]
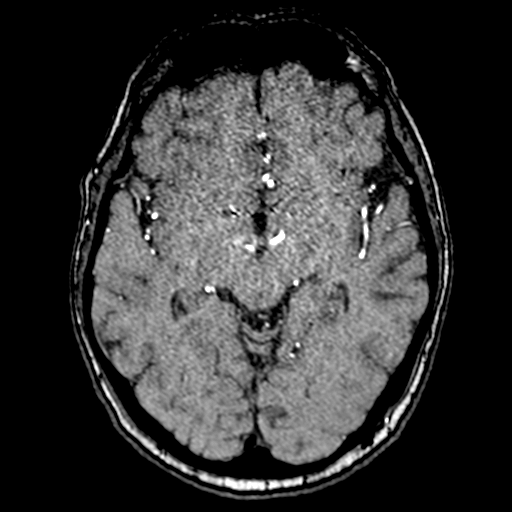
[im 132/188]
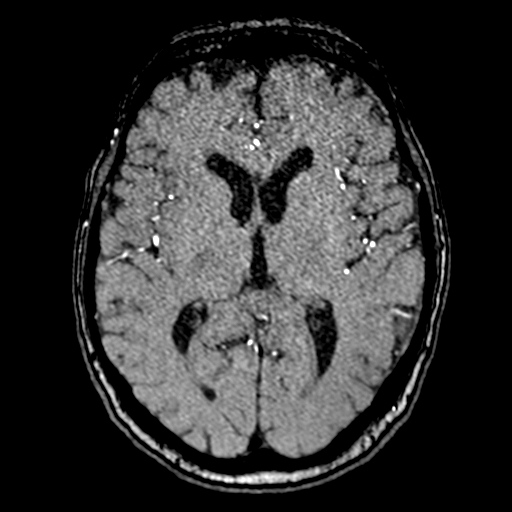
[im 156/188]
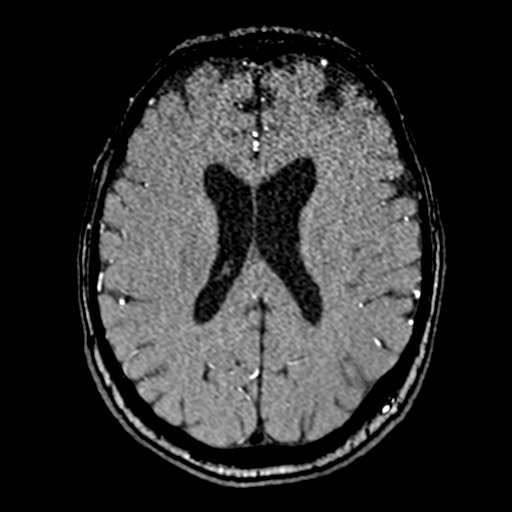
[im 160/188]
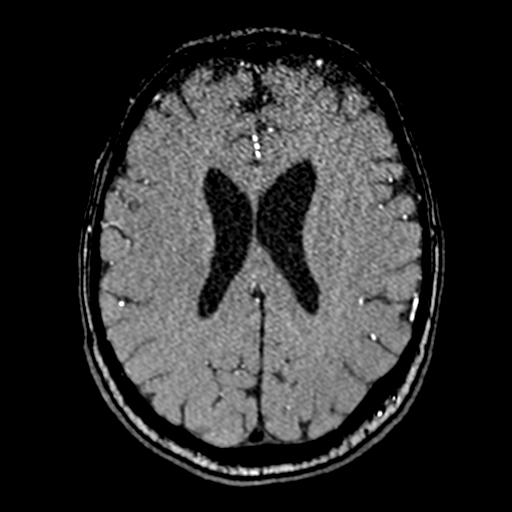
[im 180/188]
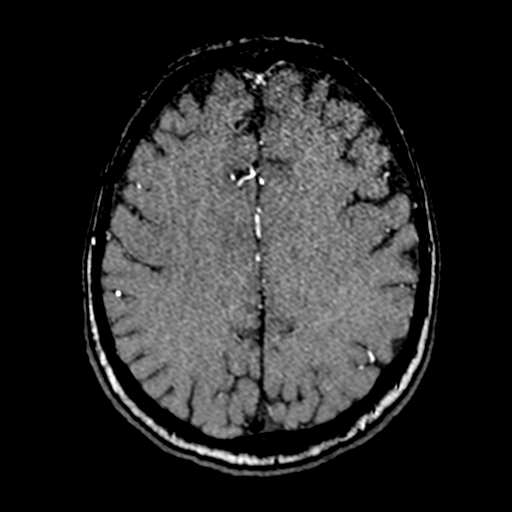

[19 of 48 positions shown; findings below may reference images not displayed]

FINDINGS: MR HEAD FINDINGS

Brain: Scattered cortical and white matter infarcts along the high
left frontal, parietal and occipital convexities, in the superior
division watershed territory. No infarct seen in a different
vascular territory. No acute hemorrhage, hydrocephalus, mass, or
collection. Minor chronic white matter disease. Normal brain volume

Vascular: See below

Skull and upper cervical spine: Normal marrow signal

Sinuses/Orbits: Negative

MR CIRCLE OF WILLIS FINDINGS

The carotid and vertebral arteries are smooth and widely patent. A
small hypointense appearance in the central distal basilar is
artifact based on postcontrast neck MRA which covers the same level.
High-grade narrowing at the left MCA bifurcation, just beyond a
superiorly directed M2 branch. Irregularity of bilateral PCA
vessels, presumably atheromatous with up to moderate right P3
segment narrowing.

MRA NECK FINDINGS

Antegrade flow in the carotid and vertebral arteries.

Normal arch where covered, with 3 vessel branching. Mild left
vertebral artery dominance. The vertebral arteries have a smooth and
widely patent appearance.

The carotids also shows smoothly contoured lumen with no stenosis or
ulceration.
IMPRESSION: 1. Cluster of small acute infarcts in the left MCA upper division
watershed territory. There is focal high-grade narrowing at the left
MCA bifurcation.
2. Irregularity of bilateral posterior cerebral arteries with
moderate right P3 segment stenosis, presumably atheromatous.

## 2022-04-28 DIAGNOSIS — K219 Gastro-esophageal reflux disease without esophagitis: Secondary | ICD-10-CM | POA: Diagnosis not present

## 2022-04-28 DIAGNOSIS — E7849 Other hyperlipidemia: Secondary | ICD-10-CM | POA: Diagnosis not present

## 2022-04-28 DIAGNOSIS — I10 Essential (primary) hypertension: Secondary | ICD-10-CM | POA: Diagnosis not present

## 2022-04-28 DIAGNOSIS — Z8673 Personal history of transient ischemic attack (TIA), and cerebral infarction without residual deficits: Secondary | ICD-10-CM | POA: Diagnosis not present

## 2022-04-29 DIAGNOSIS — K219 Gastro-esophageal reflux disease without esophagitis: Secondary | ICD-10-CM | POA: Diagnosis not present

## 2022-04-29 DIAGNOSIS — I10 Essential (primary) hypertension: Secondary | ICD-10-CM | POA: Diagnosis not present

## 2022-04-29 DIAGNOSIS — Z125 Encounter for screening for malignant neoplasm of prostate: Secondary | ICD-10-CM | POA: Diagnosis not present

## 2022-04-29 DIAGNOSIS — Z8673 Personal history of transient ischemic attack (TIA), and cerebral infarction without residual deficits: Secondary | ICD-10-CM | POA: Diagnosis not present

## 2022-07-21 DIAGNOSIS — Z0000 Encounter for general adult medical examination without abnormal findings: Secondary | ICD-10-CM | POA: Diagnosis not present

## 2022-07-21 DIAGNOSIS — I10 Essential (primary) hypertension: Secondary | ICD-10-CM | POA: Diagnosis not present

## 2022-07-21 DIAGNOSIS — Z8673 Personal history of transient ischemic attack (TIA), and cerebral infarction without residual deficits: Secondary | ICD-10-CM | POA: Diagnosis not present

## 2022-07-21 DIAGNOSIS — K219 Gastro-esophageal reflux disease without esophagitis: Secondary | ICD-10-CM | POA: Diagnosis not present

## 2022-07-21 DIAGNOSIS — E7849 Other hyperlipidemia: Secondary | ICD-10-CM | POA: Diagnosis not present

## 2022-07-24 DIAGNOSIS — H6122 Impacted cerumen, left ear: Secondary | ICD-10-CM | POA: Diagnosis not present

## 2022-07-24 DIAGNOSIS — J301 Allergic rhinitis due to pollen: Secondary | ICD-10-CM | POA: Diagnosis not present

## 2022-07-24 DIAGNOSIS — E663 Overweight: Secondary | ICD-10-CM | POA: Diagnosis not present

## 2022-07-24 DIAGNOSIS — Z6827 Body mass index (BMI) 27.0-27.9, adult: Secondary | ICD-10-CM | POA: Diagnosis not present

## 2022-11-01 DIAGNOSIS — M810 Age-related osteoporosis without current pathological fracture: Secondary | ICD-10-CM | POA: Diagnosis not present

## 2022-11-22 DIAGNOSIS — E7849 Other hyperlipidemia: Secondary | ICD-10-CM | POA: Diagnosis not present

## 2022-11-22 DIAGNOSIS — K219 Gastro-esophageal reflux disease without esophagitis: Secondary | ICD-10-CM | POA: Diagnosis not present

## 2022-11-22 DIAGNOSIS — Z8673 Personal history of transient ischemic attack (TIA), and cerebral infarction without residual deficits: Secondary | ICD-10-CM | POA: Diagnosis not present

## 2022-11-22 DIAGNOSIS — I10 Essential (primary) hypertension: Secondary | ICD-10-CM | POA: Diagnosis not present

## 2022-11-22 DIAGNOSIS — Z0000 Encounter for general adult medical examination without abnormal findings: Secondary | ICD-10-CM | POA: Diagnosis not present

## 2022-11-25 DIAGNOSIS — Z0000 Encounter for general adult medical examination without abnormal findings: Secondary | ICD-10-CM | POA: Diagnosis not present

## 2022-11-25 DIAGNOSIS — K219 Gastro-esophageal reflux disease without esophagitis: Secondary | ICD-10-CM | POA: Diagnosis not present

## 2022-11-25 DIAGNOSIS — E7849 Other hyperlipidemia: Secondary | ICD-10-CM | POA: Diagnosis not present

## 2022-11-25 DIAGNOSIS — I10 Essential (primary) hypertension: Secondary | ICD-10-CM | POA: Diagnosis not present

## 2022-11-25 DIAGNOSIS — Z8673 Personal history of transient ischemic attack (TIA), and cerebral infarction without residual deficits: Secondary | ICD-10-CM | POA: Diagnosis not present

## 2023-03-07 DIAGNOSIS — H524 Presbyopia: Secondary | ICD-10-CM | POA: Diagnosis not present

## 2023-03-21 DIAGNOSIS — I10 Essential (primary) hypertension: Secondary | ICD-10-CM | POA: Diagnosis not present

## 2023-03-21 DIAGNOSIS — E7849 Other hyperlipidemia: Secondary | ICD-10-CM | POA: Diagnosis not present

## 2023-03-21 DIAGNOSIS — K219 Gastro-esophageal reflux disease without esophagitis: Secondary | ICD-10-CM | POA: Diagnosis not present

## 2023-03-21 DIAGNOSIS — N1831 Chronic kidney disease, stage 3a: Secondary | ICD-10-CM | POA: Diagnosis not present

## 2023-06-14 DIAGNOSIS — R509 Fever, unspecified: Secondary | ICD-10-CM | POA: Diagnosis not present

## 2023-06-14 DIAGNOSIS — K529 Noninfective gastroenteritis and colitis, unspecified: Secondary | ICD-10-CM | POA: Diagnosis not present

## 2023-06-16 DIAGNOSIS — J101 Influenza due to other identified influenza virus with other respiratory manifestations: Secondary | ICD-10-CM | POA: Diagnosis not present

## 2023-06-16 DIAGNOSIS — R1110 Vomiting, unspecified: Secondary | ICD-10-CM | POA: Diagnosis not present

## 2023-06-26 DIAGNOSIS — I10 Essential (primary) hypertension: Secondary | ICD-10-CM | POA: Diagnosis not present

## 2023-06-26 DIAGNOSIS — Z125 Encounter for screening for malignant neoplasm of prostate: Secondary | ICD-10-CM | POA: Diagnosis not present

## 2023-06-26 DIAGNOSIS — N1831 Chronic kidney disease, stage 3a: Secondary | ICD-10-CM | POA: Diagnosis not present

## 2023-06-26 DIAGNOSIS — Z0000 Encounter for general adult medical examination without abnormal findings: Secondary | ICD-10-CM | POA: Diagnosis not present

## 2023-06-26 DIAGNOSIS — K219 Gastro-esophageal reflux disease without esophagitis: Secondary | ICD-10-CM | POA: Diagnosis not present

## 2023-06-26 DIAGNOSIS — Z6828 Body mass index (BMI) 28.0-28.9, adult: Secondary | ICD-10-CM | POA: Diagnosis not present

## 2023-06-26 DIAGNOSIS — E7849 Other hyperlipidemia: Secondary | ICD-10-CM | POA: Diagnosis not present

## 2023-09-28 DIAGNOSIS — C4441 Basal cell carcinoma of skin of scalp and neck: Secondary | ICD-10-CM | POA: Diagnosis not present

## 2023-10-31 DIAGNOSIS — Z0000 Encounter for general adult medical examination without abnormal findings: Secondary | ICD-10-CM | POA: Diagnosis not present

## 2023-10-31 DIAGNOSIS — K219 Gastro-esophageal reflux disease without esophagitis: Secondary | ICD-10-CM | POA: Diagnosis not present

## 2023-10-31 DIAGNOSIS — E7849 Other hyperlipidemia: Secondary | ICD-10-CM | POA: Diagnosis not present

## 2023-10-31 DIAGNOSIS — Z8673 Personal history of transient ischemic attack (TIA), and cerebral infarction without residual deficits: Secondary | ICD-10-CM | POA: Diagnosis not present

## 2023-10-31 DIAGNOSIS — I10 Essential (primary) hypertension: Secondary | ICD-10-CM | POA: Diagnosis not present

## 2023-11-23 DIAGNOSIS — L905 Scar conditions and fibrosis of skin: Secondary | ICD-10-CM | POA: Diagnosis not present

## 2023-11-23 DIAGNOSIS — D485 Neoplasm of uncertain behavior of skin: Secondary | ICD-10-CM | POA: Diagnosis not present

## 2023-12-13 DIAGNOSIS — Z6826 Body mass index (BMI) 26.0-26.9, adult: Secondary | ICD-10-CM | POA: Diagnosis not present

## 2023-12-13 DIAGNOSIS — R030 Elevated blood-pressure reading, without diagnosis of hypertension: Secondary | ICD-10-CM | POA: Diagnosis not present

## 2023-12-13 DIAGNOSIS — E663 Overweight: Secondary | ICD-10-CM | POA: Diagnosis not present

## 2023-12-13 DIAGNOSIS — J069 Acute upper respiratory infection, unspecified: Secondary | ICD-10-CM | POA: Diagnosis not present

## 2024-01-17 DIAGNOSIS — Z8673 Personal history of transient ischemic attack (TIA), and cerebral infarction without residual deficits: Secondary | ICD-10-CM | POA: Diagnosis not present

## 2024-01-17 DIAGNOSIS — N182 Chronic kidney disease, stage 2 (mild): Secondary | ICD-10-CM | POA: Diagnosis not present

## 2024-01-17 DIAGNOSIS — I10 Essential (primary) hypertension: Secondary | ICD-10-CM | POA: Diagnosis not present

## 2024-01-17 DIAGNOSIS — E7849 Other hyperlipidemia: Secondary | ICD-10-CM | POA: Diagnosis not present

## 2024-01-19 DIAGNOSIS — E7849 Other hyperlipidemia: Secondary | ICD-10-CM | POA: Diagnosis not present

## 2024-01-19 DIAGNOSIS — I10 Essential (primary) hypertension: Secondary | ICD-10-CM | POA: Diagnosis not present

## 2024-01-19 DIAGNOSIS — Z8673 Personal history of transient ischemic attack (TIA), and cerebral infarction without residual deficits: Secondary | ICD-10-CM | POA: Diagnosis not present

## 2024-01-19 DIAGNOSIS — N182 Chronic kidney disease, stage 2 (mild): Secondary | ICD-10-CM | POA: Diagnosis not present
